# Patient Record
Sex: Male | Born: 1960 | Race: Black or African American | Hispanic: No | Marital: Married | Smoking: Never smoker
Health system: Southern US, Community
[De-identification: ages and names within clinical notes are randomized; demographics above are authoritative.]

## PROBLEM LIST (undated history)

## (undated) ENCOUNTER — Emergency Department (HOSPITAL_COMMUNITY): Admission: EM | Payer: Federal, State, Local not specified - PPO | Source: Home / Self Care

## (undated) DIAGNOSIS — N529 Male erectile dysfunction, unspecified: Secondary | ICD-10-CM

## (undated) DIAGNOSIS — F32A Depression, unspecified: Secondary | ICD-10-CM

## (undated) DIAGNOSIS — R51 Headache: Secondary | ICD-10-CM

## (undated) DIAGNOSIS — F419 Anxiety disorder, unspecified: Secondary | ICD-10-CM

## (undated) DIAGNOSIS — G8929 Other chronic pain: Secondary | ICD-10-CM

## (undated) DIAGNOSIS — R519 Headache, unspecified: Secondary | ICD-10-CM

## (undated) DIAGNOSIS — I10 Essential (primary) hypertension: Secondary | ICD-10-CM

## (undated) DIAGNOSIS — F329 Major depressive disorder, single episode, unspecified: Secondary | ICD-10-CM

## (undated) DIAGNOSIS — E119 Type 2 diabetes mellitus without complications: Secondary | ICD-10-CM

## (undated) DIAGNOSIS — M1990 Unspecified osteoarthritis, unspecified site: Secondary | ICD-10-CM

## (undated) HISTORY — DX: Other chronic pain: G89.29

## (undated) HISTORY — DX: Anxiety disorder, unspecified: F41.9

## (undated) HISTORY — DX: Major depressive disorder, single episode, unspecified: F32.9

## (undated) HISTORY — DX: Type 2 diabetes mellitus without complications: E11.9

## (undated) HISTORY — PX: ELBOW BURSA SURGERY: SHX615

## (undated) HISTORY — DX: Male erectile dysfunction, unspecified: N52.9

## (undated) HISTORY — DX: Essential (primary) hypertension: I10

## (undated) HISTORY — DX: Depression, unspecified: F32.A

## (undated) HISTORY — DX: Headache, unspecified: R51.9

## (undated) HISTORY — DX: Headache: R51

## (undated) HISTORY — DX: Unspecified osteoarthritis, unspecified site: M19.90

## (undated) HISTORY — PX: KNEE SURGERY: SHX244

---

## 2001-05-05 ENCOUNTER — Encounter: Payer: Self-pay | Admitting: Emergency Medicine

## 2001-05-05 ENCOUNTER — Emergency Department (HOSPITAL_COMMUNITY): Admission: EM | Admit: 2001-05-05 | Discharge: 2001-05-06 | Payer: Self-pay | Admitting: Emergency Medicine

## 2008-05-19 DIAGNOSIS — E119 Type 2 diabetes mellitus without complications: Secondary | ICD-10-CM

## 2008-05-19 DIAGNOSIS — I10 Essential (primary) hypertension: Secondary | ICD-10-CM

## 2008-05-19 HISTORY — DX: Type 2 diabetes mellitus without complications: E11.9

## 2008-05-19 HISTORY — DX: Essential (primary) hypertension: I10

## 2010-06-09 ENCOUNTER — Encounter: Payer: Self-pay | Admitting: Family Medicine

## 2010-12-10 ENCOUNTER — Other Ambulatory Visit: Payer: Self-pay | Admitting: Physical Medicine and Rehabilitation

## 2010-12-10 DIAGNOSIS — M25522 Pain in left elbow: Secondary | ICD-10-CM

## 2010-12-12 ENCOUNTER — Ambulatory Visit
Admission: RE | Admit: 2010-12-12 | Discharge: 2010-12-12 | Disposition: A | Discharge status: Home / Self Care | Payer: Federal, State, Local not specified - PPO | Source: Ambulatory Visit | Attending: Physical Medicine and Rehabilitation | Admitting: Physical Medicine and Rehabilitation

## 2010-12-12 DIAGNOSIS — M25522 Pain in left elbow: Secondary | ICD-10-CM

## 2010-12-13 ENCOUNTER — Other Ambulatory Visit: Payer: Self-pay

## 2011-10-01 ENCOUNTER — Ambulatory Visit (INDEPENDENT_AMBULATORY_CARE_PROVIDER_SITE_OTHER): Payer: Federal, State, Local not specified - PPO | Admitting: Family Medicine

## 2011-10-01 ENCOUNTER — Ambulatory Visit: Payer: Federal, State, Local not specified - PPO

## 2011-10-01 VITALS — BP 143/81 | HR 65 | Temp 98.4°F | Resp 18 | Ht 73.0 in | Wt 230.4 lb

## 2011-10-01 DIAGNOSIS — R05 Cough: Secondary | ICD-10-CM

## 2011-10-01 DIAGNOSIS — R059 Cough, unspecified: Secondary | ICD-10-CM

## 2011-10-01 DIAGNOSIS — J209 Acute bronchitis, unspecified: Secondary | ICD-10-CM

## 2011-10-01 DIAGNOSIS — E119 Type 2 diabetes mellitus without complications: Secondary | ICD-10-CM

## 2011-10-01 LAB — POCT CBC
Granulocyte percent: 52.7 %G (ref 37–80)
HCT, POC: 44.2 % (ref 43.5–53.7)
Hemoglobin: 14.6 g/dL (ref 14.1–18.1)
Lymph, poc: 2.5 (ref 0.6–3.4)
MCH, POC: 27.5 pg (ref 27–31.2)
MCHC: 33 g/dL (ref 31.8–35.4)
MCV: 83.3 fL (ref 80–97)
MID (cbc): 0.6 (ref 0–0.9)
MPV: 9.3 fL (ref 0–99.8)
POC Granulocyte: 3.5 (ref 2–6.9)
POC LYMPH PERCENT: 37.6 %L (ref 10–50)
POC MID %: 9.7 %M (ref 0–12)
Platelet Count, POC: 302 10*3/uL (ref 142–424)
RBC: 5.31 M/uL (ref 4.69–6.13)
RDW, POC: 14.4 %
WBC: 6.6 10*3/uL (ref 4.6–10.2)

## 2011-10-01 LAB — LIPID PANEL
Cholesterol: 160 mg/dL (ref 0–200)
HDL: 38 mg/dL — ABNORMAL LOW (ref >39)
LDL Cholesterol: 81 mg/dL (ref 0–99)
Total CHOL/HDL Ratio: 4.2 Ratio
Triglycerides: 206 mg/dL — ABNORMAL HIGH (ref <150)
VLDL: 41 mg/dL — ABNORMAL HIGH (ref 0–40)

## 2011-10-01 LAB — GLUCOSE, POCT (MANUAL RESULT ENTRY): POC Glucose: 102

## 2011-10-01 LAB — POCT GLYCOSYLATED HEMOGLOBIN (HGB A1C): Hemoglobin A1C: 7.5

## 2011-10-01 MED ORDER — HYDROCODONE-HOMATROPINE 5-1.5 MG/5ML PO SYRP: 5.0000 mL | ORAL_SOLUTION | Freq: Three times a day (TID) | ORAL | Status: AC | PRN

## 2011-10-01 MED ORDER — METFORMIN HCL 500 MG PO TABS: 500.0000 mg | ORAL_TABLET | Freq: Two times a day (BID) | ORAL | Status: AC

## 2011-10-01 MED ORDER — AZITHROMYCIN 250 MG PO TABS: ORAL_TABLET | ORAL | Status: AC

## 2011-10-01 NOTE — Patient Instructions (Signed)
Diabetes and Exercise Regular exercise is important and can help:   Control blood glucose (sugar).   Decrease blood pressure.    Control blood lipids (cholesterol, triglycerides).   Improve overall health.  BENEFITS FROM EXERCISE  Improved fitness.   Improved flexibility.   Improved endurance.   Increased bone density.   Weight control.   Increased muscle strength.   Decreased body fat.   Improvement of the body's use of insulin, a hormone.   Increased insulin sensitivity.   Reduction of insulin needs.   Reduced stress and tension.   Helps you feel better.  People with diabetes who add exercise to their lifestyle gain additional benefits, including:  Weight loss.   Reduced appetite.   Improvement of the body's use of blood glucose.   Decreased risk factors for heart disease:   Lowering of cholesterol and triglycerides.   Raising the level of good cholesterol (high-density lipoproteins, HDL).   Lowering blood sugar.   Decreased blood pressure.  TYPE 1 DIABETES AND EXERCISE  Exercise will usually lower your blood glucose.   If blood glucose is greater than 240 mg/dl, check urine ketones. If ketones are present, do not exercise.   Location of the insulin injection sites may need to be adjusted with exercise. Avoid injecting insulin into areas of the body that will be exercised. For example, avoid injecting insulin into:   The arms when playing tennis.   The legs when jogging. For more information, discuss this with your caregiver.   Keep a record of:   Food intake.   Type and amount of exercise.   Expected peak times of insulin action.   Blood glucose levels.  Do this before, during, and after exercise. Review your records with your caregiver. This will help you to develop guidelines for adjusting food intake and insulin amounts.  TYPE 2 DIABETES AND EXERCISE  Regular physical activity can help control blood glucose.   Exercise is important  because it may:   Increase the body's sensitivity to insulin.   Improve blood glucose control.   Exercise reduces the risk of heart disease. It decreases serum cholesterol and triglycerides. It also lowers blood pressure.   Those who take insulin or oral hypoglycemic agents should watch for signs of hypoglycemia. These signs include dizziness, shaking, sweating, chills, and confusion.   Body water is lost during exercise. It must be replaced. This will help to avoid loss of body fluids (dehydration) or heat stroke.  Be sure to talk to your caregiver before starting an exercise program to make sure it is safe for you. Remember, any activity is better than none.  Document Released: 07/26/2003 Document Revised: 04/24/2011 Document Reviewed: 11/09/2008 [facility] Patient Information 2012 [place], [place].Bronchitis Bronchitis is the body's way of reacting to injury and/or infection (inflammation) of the bronchi. Bronchi are the air tubes that extend from the windpipe into the lungs. If the inflammation becomes severe, it may cause shortness of breath. CAUSES  Inflammation may be caused by:  A virus.   Germs (bacteria).   Dust.   Allergens.   Pollutants and many other irritants.  The cells lining the bronchial tree are covered with tiny hairs (cilia). These constantly beat upward, away from the lungs, toward the mouth. This keeps the lungs free of pollutants. When these cells become too irritated and are unable to do their job, mucus begins to develop. This causes the characteristic cough of bronchitis. The cough clears the lungs when the cilia are unable to do  their job. Without either of these protective mechanisms, the mucus would settle in the lungs. Then you would develop pneumonia. Smoking is a common cause of bronchitis and can contribute to pneumonia. Stopping this habit is the single most important thing you can do to help yourself. TREATMENT   Your caregiver may prescribe an  antibiotic if the cough is caused by bacteria. Also, medicines that open up your airways make it easier to breathe. Your caregiver may also recommend or prescribe an expectorant. It will loosen the mucus to be coughed up. Only take over-the-counter or prescription medicines for pain, discomfort, or fever as directed by your caregiver.   Removing whatever causes the problem (smoking, for example) is critical to preventing the problem from getting worse.   Cough suppressants may be prescribed for relief of cough symptoms.   Inhaled medicines may be prescribed to help with symptoms now and to help prevent problems from returning.   For those with recurrent (chronic) bronchitis, there may be a need for steroid medicines.  SEEK IMMEDIATE MEDICAL CARE IF:   During treatment, you develop more pus-like mucus (purulent sputum).   You have a fever.   Your baby is older than 3 months with a rectal temperature of 102 F (38.9 C) or higher.   Your baby is 79 months old or younger with a rectal temperature of 100.4 F (38 C) or higher.   You become progressively more ill.   You have increased difficulty breathing, wheezing, or shortness of breath.  It is necessary to seek immediate medical care if you are elderly or sick from any other disease. MAKE SURE YOU:   Understand these instructions.   Will watch your condition.   Will get help right away if you are not doing well or get worse.  Document Released: 05/05/2005 Document Revised: 04/24/2011 Document Reviewed: 03/14/2008 [facility] Patient Information 2012 [place], [place].

## 2011-10-01 NOTE — Progress Notes (Signed)
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   The cough keeps him awake at night with spells of spasmodic cough. Headaches are not relieved by BC's, ibuprofen  Patient recently learned that he is diabetic, but is not taking any meds ([facility]es to the [facility])  O: Alert, NAD EOMI, no facial weakness, neck supple Oroph:  Deep cavity in tooth #13, marked nasal congestion, TM's okay No adenopathy Chest:  Decreased BS with ronchi left side Heart: reg, no murmur [facility] reading (PRIMARY) by  Dr. Milus Glazier  CXR. No infiltrate Results for orders placed in visit on 10/01/11  POCT CBC      Component Value Range   WBC 6.6  4.6 - 10.2 (K/uL)   Lymph, poc 2.5  0.6 - 3.4    POC LYMPH PERCENT 37.6  10 - 50 (%L)   MID (cbc) 0.6  0 - 0.9    POC MID % 9.7  0 - 12 (%M)   POC Granulocyte 3.5  2 - 6.9    Granulocyte percent 52.7  37 - 80 (%G)   RBC 5.31  4.69 - 6.13 (M/uL)   Hemoglobin 14.6  14.1 - 18.1 (g/dL)   HCT, POC 16.1  09.6 - 53.7 (%)   MCV 83.3  80 - 97 (fL)   MCH, POC 27.5  27 - 31.2 (pg)   MCHC 33.0  31.8 - 35.4 (g/dL)   RDW, POC 04.5     Platelet Count, POC 302  142 - 424 (K/uL)   MPV 9.3  0 - 99.8 (fL)  POCT GLYCOSYLATED HEMOGLOBIN (HGB A1C)      Component Value Range   Hemoglobin A1C 7.5    GLUCOSE, POCT (MANUAL RESULT ENTRY)      Component Value Range   POC Glucose 102     Assessment: Bronchitis, diabetes with only fair control.  Plan: Hydromet, Z-Pak  Start metformin  Recheck 48 hours

## 2011-10-03 ENCOUNTER — Ambulatory Visit (INDEPENDENT_AMBULATORY_CARE_PROVIDER_SITE_OTHER): Payer: Federal, State, Local not specified - PPO | Admitting: Family Medicine

## 2011-10-03 VITALS — BP 138/93 | HR 67 | Temp 98.2°F | Resp 16 | Ht 74.0 in | Wt 238.0 lb

## 2011-10-03 DIAGNOSIS — J40 Bronchitis, not specified as acute or chronic: Secondary | ICD-10-CM

## 2011-10-03 DIAGNOSIS — J209 Acute bronchitis, unspecified: Secondary | ICD-10-CM

## 2011-10-03 DIAGNOSIS — K047 Periapical abscess without sinus: Secondary | ICD-10-CM

## 2011-10-03 NOTE — Patient Instructions (Addendum)
Continue current medications   Health Maintenance, Males A healthy lifestyle and preventative care can promote health and wellness.  Maintain regular health, dental, and eye exams.   Eat a healthy diet. Foods like vegetables, fruits, whole grains, low-fat dairy products, and lean protein foods contain the nutrients you need without too many calories. Decrease your intake of foods high in solid fats, added sugars, and salt. Get information about a proper diet from your caregiver, if necessary.   Regular physical exercise is one of the most important things you can do for your health. Most adults should get at least 150 minutes of moderate-intensity exercise (any activity that increases your heart rate and causes you to sweat) each week. In addition, most adults need muscle-strengthening exercises on 2 or more days a week.    Maintain a healthy weight. The body mass index (BMI) is a screening tool to identify possible weight problems. It provides an estimate of body fat based on height and weight. Your caregiver can help determine your BMI, and can help you achieve or maintain a healthy weight. For adults 20 years and older:   A BMI below 18.5 is considered underweight.   A BMI of 18.5 to 24.9 is normal.   A BMI of 25 to 29.9 is considered overweight.   A BMI of 30 and above is considered obese.   Maintain normal blood lipids and cholesterol by exercising and minimizing your intake of saturated fat. Eat a balanced diet with plenty of fruits and vegetables. Blood tests for lipids and cholesterol should begin at age 75 and be repeated every 5 years. If your lipid or cholesterol levels are high, you are over 50, or you are a high risk for heart disease, you may need your cholesterol levels checked more frequently.Ongoing high lipid and cholesterol levels should be treated with medicines, if diet and exercise are not effective.   If you smoke, find out from your caregiver how to quit. If you do  not use tobacco, do not start.   If you choose to drink alcohol, do not exceed 2 drinks per day. One drink is considered to be 12 ounces (355 mL) of beer, 5 ounces (148 mL) of wine, or 1.5 ounces (44 mL) of liquor.   Avoid use of street drugs. Do not share needles with anyone. Ask for help if you need support or instructions about stopping the use of drugs.   High blood pressure causes heart disease and increases the risk of stroke. Blood pressure should be checked at least every 1 to 2 years. Ongoing high blood pressure should be treated with medicines if weight loss and exercise are not effective.   If you are 23 to 51 years old, ask your caregiver if you should take aspirin to prevent heart disease.   Diabetes screening involves taking a blood sample to check your fasting blood sugar level. This should be done once every 3 years, after age 44, if you are within normal weight and without risk factors for diabetes. Testing should be considered at a younger age or be carried out more frequently if you are overweight and have at least 1 risk factor for diabetes.   Colorectal cancer can be detected and often prevented. Most routine colorectal cancer screening begins at the age of 25 and continues through age 21. However, your caregiver may recommend screening at an earlier age if you have risk factors for colon cancer. On a yearly basis, your caregiver may provide home test  kits to check for hidden blood in the stool. Use of a small camera at the end of a tube, to directly examine the colon (sigmoidoscopy or colonoscopy), can detect the earliest forms of colorectal cancer. Talk to your caregiver about this at age 66, when routine screening begins. Direct examination of the colon should be repeated every 5 to 10 years through age 106, unless early forms of pre-cancerous polyps or small growths are found.   Hepatitis C blood testing is recommended for all people born from [birth year] through 1965 and any  individual with known risks for hepatitis C.   Healthy men should no longer receive prostate-specific antigen (PSA) blood tests as part of routine cancer screening. Consult with your caregiver about prostate cancer screening.   Testicular cancer screening is not recommended for adolescents or adult males who have no symptoms. Screening includes self-exam, caregiver exam, and other screening tests. Consult with your caregiver about any symptoms you have or any concerns you have about testicular cancer.   Practice safe sex. Use condoms and avoid high-risk sexual practices to reduce the spread of sexually transmitted infections (STIs).   Use sunscreen with a sun protection factor (SPF) of 30 or greater. Apply sunscreen liberally and repeatedly throughout the day. You should seek shade when your shadow is shorter than you. Protect yourself by wearing long sleeves, pants, a wide-brimmed hat, and sunglasses year round, whenever you are outdoors.   Notify your caregiver of new moles or changes in moles, especially if there is a change in shape or color. Also notify your caregiver if a mole is larger than the size of a pencil eraser.   A one-time screening for abdominal aortic aneurysm (AAA) and surgical repair of large AAAs by sound wave imaging (ultrasonography) is recommended for ages 16 to 54 years who are current or former smokers.   Stay current with your immunizations.  Document Released: 11/01/2007 Document Revised: 04/24/2011 Document Reviewed: 09/30/2010 [facility] Patient Information 2012 [place], [place].

## 2011-10-03 NOTE — Progress Notes (Signed)
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  He has appt in [facility].  Went to [place] for HTN and diabetes today already.  Overall, feeling better.  O:  NAD Dental:  Abscess resolving Chest:  Clear  A: resolving bronchitis  P:  Finish abx RTC prn

## 2012-03-03 ENCOUNTER — Encounter: Payer: Self-pay | Admitting: Medical

## 2012-03-03 ENCOUNTER — Ambulatory Visit (INDEPENDENT_AMBULATORY_CARE_PROVIDER_SITE_OTHER): Payer: Federal, State, Local not specified - PPO | Admitting: Medical

## 2012-03-03 VITALS — BP 140/90 | HR 68 | Temp 98.2°F | Resp 16 | Ht 74.0 in | Wt 232.0 lb

## 2012-03-03 DIAGNOSIS — I10 Essential (primary) hypertension: Secondary | ICD-10-CM

## 2012-03-03 DIAGNOSIS — R42 Dizziness and giddiness: Secondary | ICD-10-CM

## 2012-03-03 DIAGNOSIS — F17200 Nicotine dependence, unspecified, uncomplicated: Secondary | ICD-10-CM

## 2012-03-03 DIAGNOSIS — Z720 Tobacco use: Secondary | ICD-10-CM

## 2012-03-03 DIAGNOSIS — E119 Type 2 diabetes mellitus without complications: Secondary | ICD-10-CM

## 2012-03-03 DIAGNOSIS — R9431 Abnormal electrocardiogram [ECG] [EKG]: Secondary | ICD-10-CM

## 2012-03-03 MED ORDER — ATENOLOL 25 MG PO TABS: 25.0000 mg | ORAL_TABLET | Freq: Every day | ORAL | Status: DC

## 2012-03-03 MED ORDER — LISINOPRIL 20 MG PO TABS: 20.0000 mg | ORAL_TABLET | Freq: Every day | ORAL | Status: DC

## 2012-03-03 NOTE — Progress Notes (Signed)
Subjective: Here as a new patient today.  Has always seen the [facility] hospital in [place] or [place], but feels like they don't take time to look things over, and he doesn't interact well with some office staff.  Wants a local primary care provider.   He has hx/o diabetes and hypertension.  He had been on Lisinopril for years, but a week ago when he went to the [place], BP was 180/133.   They added HCTZ.   However, since starting the HCTZ he notes bad headache, dizziness.  He has used Amlodipine in remote past and had fatigue, leg aches, sluggish feeling.   Has tolerated Lisinopril just fine.  He feels like he can't keeping taking HCTZ. He thinks his BP elevation is due to stress.  Having marital issues.     [facility].  Last cardiology eval 10+ years ago.   Just had labs last week, cholesterol was normal.  Not sure about the other labs but thinks they were normal.    Diabetes - checks glucose some, gets 139-140s in the mornings, fasting.   BPs been running 140s/90s.   Past Medical History  Diagnosis Date  . Diabetes mellitus without complication 2010    type II  . Hypertension 2010  . Chronic headache   . Depression   . Anxiety   . ED (erectile dysfunction)   . Arthritis    ROS Gen: no fatigue, no weight changes, no fever Heent: negative Lungs: intermittent occasional SOB with eating?.  otherwise denies SOB, DOE Heart: intermittent brief sharp chest pains.  Not particularly related to activity.  No arm pain or numbness, no associated nausea, sweats, paresthesias. Ext: no edema Neuro: headache, dizziness, no numbness, tingling, weakness    Objective:   Physical Exam  Filed Vitals:   03/03/12 1026  BP: 140/90  Pulse: 68  Temp: 98.2 F (36.8 C)  Resp: 16    General appearance: alert, no distress, WD/WN, AA male HEENT: normocephalic, sclerae anicteric, TMs pearly, nares patent, no discharge or erythema, pharynx normal Oral cavity: MMM, left upper  molars with obvious decay Neck: supple, no lymphadenopathy, no thyromegaly, no masses, no bruits Heart: RRR, normal S1, S2, no murmurs Lungs: CTA bilaterally, no wheezes, rhonchi, or rales Abdomen: +bs, soft, non tender, non distended, no masses, no hepatomegaly, no splenomegaly Pulses: 2+ symmetric, upper and lower extremities, normal cap refill Neuro: Cn2-12 intact, nonfocal exam   Adult ECG Report  Indication: hypertension, dizziness  Rate: 63bpm  Rhythm: normal sinus rhythm  QRS Axis: 38 degrees  PR Interval:  QRS Duration:  QTc:  Conduction Disturbances: none  Other Abnormalities: possible ST elevation V2, V3, T wave abnormality V3, V4  Patient's cardiac risk factors are: diabetes mellitus, hypertension, male gender and smoking/ tobacco exposure.  EKG comparison: none  Narrative Interpretation: possible anterior ischemia/strain pattern    Assessment and Plan :    Encounter Diagnoses  Name Primary?  . Essential hypertension, benign Yes  . Abnormal EKG   . Type II or unspecified type diabetes mellitus without mention of complication, not stated as uncontrolled   . Tobacco use   . Dizziness    HTN - stop Lisinopril HCT.  Begin Lisinopril 20mg  daily, begin Atenolol 25mg  daily.    Abnormal EKG- i discussed the abnormalities.  I strongly recommended we get him in with cardiology this week.  He declines referral today [facility].  He will take EKG with him and wants to pursue cardiology consult through [facility] tomorrow instead of local cardiology referral.  I discussed the importance of f/u within a week given EKG findings.  He assures me he will call back for referral pending his consult at [facility] tomorrow.  Discussed his risk factors, needed evaluation, need to stop tobacco and get BP under control.   Diabetes - he will bring back labs that were just done.    Tobacco use - need to stop tobacco completley although he currently uses this  intermittent.    Dizziness - f/u with cardiology.

## 2012-04-01 ENCOUNTER — Encounter: Payer: Self-pay | Admitting: Family Medicine

## 2012-04-01 ENCOUNTER — Ambulatory Visit (INDEPENDENT_AMBULATORY_CARE_PROVIDER_SITE_OTHER): Payer: Federal, State, Local not specified - PPO | Admitting: Family Medicine

## 2012-04-01 VITALS — BP 130/82 | HR 78 | Wt 237.0 lb

## 2012-04-01 DIAGNOSIS — M948X9 Other specified disorders of cartilage, unspecified sites: Secondary | ICD-10-CM

## 2012-04-01 DIAGNOSIS — M2580 Other specified joint disorders, unspecified joint: Secondary | ICD-10-CM

## 2012-04-01 NOTE — Progress Notes (Signed)
  Subjective:    Patient ID: Kelly Valdez, male    DOB: [birth year], 51 y.o.   MRN: [id number]  HPI He complains of left great toe pain for the last 2 days. This started after he got a  Shoes. He then went back to wearing his old shoes and still had some slight difficulty but states that today he is having less difficulty with the pain.   Review of Systems     Objective:   Physical Exam  left great toe shows no pain on motion of any the joints. No tenderness to palpation. It is not hot or warm to touch. He does have some slight tenderness over the sesamoid.      Assessment & Plan:   1. Sesamoiditis    I explained the diagnoses with him in detail. Recommended wearing an arch support for this. He will continue on ibuprofen and use Tylenol if he needs it. Also discussed possibly going back to his old shoes.

## 2012-04-01 NOTE — Patient Instructions (Signed)
To back to using-year-old shoes or put good arch supports in the new ones. Keep taking the ibuprofen and you can add Tylenol, 2 tablets 4 times a day to that.

## 2012-10-15 ENCOUNTER — Other Ambulatory Visit: Payer: Self-pay | Admitting: Family Medicine

## 2014-04-18 ENCOUNTER — Ambulatory Visit (INDEPENDENT_AMBULATORY_CARE_PROVIDER_SITE_OTHER): Payer: Federal, State, Local not specified - PPO | Admitting: Emergency Medicine

## 2014-04-18 VITALS — BP 132/88 | HR 80 | Temp 98.3°F | Resp 16 | Ht 74.0 in | Wt 223.8 lb

## 2014-04-18 DIAGNOSIS — A084 Viral intestinal infection, unspecified: Secondary | ICD-10-CM

## 2014-04-18 DIAGNOSIS — R1030 Lower abdominal pain, unspecified: Secondary | ICD-10-CM

## 2014-04-18 LAB — BASIC METABOLIC PANEL
BUN: 27 mg/dL — ABNORMAL HIGH (ref 6–23)
CO2: 23 mEq/L (ref 19–32)
Calcium: 9.7 mg/dL (ref 8.4–10.5)
Chloride: 104 mEq/L (ref 96–112)
Creat: 1.72 mg/dL — ABNORMAL HIGH (ref 0.50–1.35)
Glucose, Bld: 253 mg/dL — ABNORMAL HIGH (ref 70–99)
Potassium: 3.9 mEq/L (ref 3.5–5.3)
Sodium: 138 mEq/L (ref 135–145)

## 2014-04-18 LAB — POCT CBC
Granulocyte percent: 44 %G (ref 37–80)
HCT, POC: 48 % (ref 43.5–53.7)
Hemoglobin: 15.6 g/dL (ref 14.1–18.1)
LYMPH, POC: 2.7 (ref 0.6–3.4)
MCH: 28 pg (ref 27–31.2)
MCHC: 32.6 g/dL (ref 31.8–35.4)
MCV: 85.9 fL (ref 80–97)
MID (CBC): 0.5 (ref 0–0.9)
MPV: 7.7 fL (ref 0–99.8)
PLATELET COUNT, POC: 189 10*3/uL (ref 142–424)
POC Granulocyte: 2.5 (ref 2–6.9)
POC LYMPH %: 47.9 % (ref 10–50)
POC MID %: 8.1 % (ref 0–12)
RBC: 5.59 M/uL (ref 4.69–6.13)
RDW, POC: 14.5 %
WBC: 5.7 10*3/uL (ref 4.6–10.2)

## 2014-04-18 MED ORDER — LOPERAMIDE HCL 2 MG PO TABS: 2.0000 mg | ORAL_TABLET | Freq: Four times a day (QID) | ORAL | Status: DC | PRN

## 2014-04-18 MED ORDER — ONDANSETRON 8 MG PO TBDP: 8.0000 mg | ORAL_TABLET | Freq: Three times a day (TID) | ORAL | Status: DC | PRN

## 2014-04-18 NOTE — Progress Notes (Signed)
[facility] [address], [place] [id number] [phone]  Date:  04/18/2014   Name:  Kelly SearMichael T Azad   DOB:  11/25/1960   MRN:  [id number]  PCP:  Carollee HerterLALONDE,JOHN CHARLES, MD    Chief Complaint: Bloated; Abdominal Cramping; Fatigue; and Diarrhea   History of Present Illness:  Kelly Valdez is a 53 y.o. very pleasant male patient who presents with the following:  Regular BID bowel movements.  Says Saturday he developed loose stools.  The patient has no complaint of blood, mucous, or pus in her stools.  Has cramping abdominal pain and nausea associated with fevered sensation on Monday.  No vomiting. Wife has similar symptoms.   No appetite.   Pain across lower abdomen. No improvement with over the counter medications or other home remedies.  Denies other complaint or health concern today.   There are no active problems to display for this patient.   Past Medical History  Diagnosis Date  . Diabetes mellitus without complication 2010    type II  . Hypertension 2010  . Chronic headache   . Depression   . Anxiety   . ED (erectile dysfunction)   . Arthritis     No past surgical history on file.  History  Substance Use Topics  . Smoking status: Never Smoker   . Smokeless tobacco: Never Used  . Alcohol Use: No    No family history on file.  No Known Allergies  Medication list has been reviewed and updated.  Current Outpatient Prescriptions on File Prior to Visit  Medication Sig Dispense Refill  . atenolol (TENORMIN) 25 MG tablet Take 1 tablet (25 mg total) by mouth daily. 30 tablet 2  . ibuprofen (ADVIL,MOTRIN) 800 MG tablet Take 800 mg by mouth 3 (three) times daily.    Marland Kitchen. lisinopril (PRINIVIL,ZESTRIL) 20 MG tablet Take 1 tablet (20 mg total) by mouth daily. 30 tablet 2  . traMADol (ULTRAM) 50 MG tablet Take 50 mg by mouth every 6 (six) hours as needed.    . traZODone (DESYREL) 50 MG tablet Take 50 mg by mouth at bedtime.    . metFORMIN  (GLUCOPHAGE) 500 MG tablet Take 1 tablet (500 mg total) by mouth 2 (two) times daily with a meal. 180 tablet 3   No current facility-administered medications on file prior to visit.    Review of Systems:  As per HPI, otherwise negative.    Physical Examination: Filed Vitals:   04/18/14 0906  BP: 132/88  Pulse: 80  Temp: 98.3 F (36.8 C)  Resp: 16   Filed Vitals:   04/18/14 0906  Height: 6\' 2"  (1.88 m)  Weight: 223 lb 12.8 oz (101.515 kg)   Body mass index is 28.72 kg/(m^2). Ideal Body Weight: Weight in (lb) to have BMI = 25: 194.3  GEN: WDWN, NAD, Non-toxic, A & O x 3 HEENT: Atraumatic, Normocephalic. Neck supple. No masses, No LAD. Ears and Nose: No external deformity. CV: RRR, No M/G/R. No JVD. No thrill. No extra heart sounds. PULM: CTA B, no wheezes, crackles, rhonchi. No retractions. No resp. distress. No accessory muscle use. ABD: S, NT, ND, +BS. No rebound. No HSM. EXTR: No c/c/e NEURO Normal gait.  PSYCH: Normally interactive. Conversant. Not depressed or anxious appearing.  Calm demeanor.    Assessment and Plan: Gastroenteritis Needs colonoscopy zofran Imodium  Signed,  Phillips OdorJeffery Yicel Shannon, MD   Results for orders placed or performed in visit on 04/18/14  POCT CBC  Result  Value Ref Range   WBC 5.7 4.6 - 10.2 K/uL   Lymph, poc 2.7 0.6 - 3.4   POC LYMPH PERCENT 47.9 10 - 50 %L   MID (cbc) 0.5 0 - 0.9   POC MID % 8.1 0 - 12 %M   POC Granulocyte 2.5 2 - 6.9   Granulocyte percent 44.0 37 - 80 %G   RBC 5.59 4.69 - 6.13 M/uL   Hemoglobin 15.6 14.1 - 18.1 g/dL   HCT, POC 16.148.0 09.643.5 - 53.7 %   MCV 85.9 80 - 97 fL   MCH, POC 28.0 27 - 31.2 pg   MCHC 32.6 31.8 - 35.4 g/dL   RDW, POC 04.514.5 %   Platelet Count, POC 189 142 - 424 K/uL   MPV 7.7 0 - 99.8 fL

## 2014-04-18 NOTE — Patient Instructions (Signed)

## 2014-12-15 ENCOUNTER — Ambulatory Visit (INDEPENDENT_AMBULATORY_CARE_PROVIDER_SITE_OTHER): Payer: Federal, State, Local not specified - PPO | Admitting: Emergency Medicine

## 2014-12-15 VITALS — BP 110/60 | HR 96 | Temp 98.3°F | Resp 18 | Ht 74.0 in | Wt 208.0 lb

## 2014-12-15 DIAGNOSIS — T783XXA Angioneurotic edema, initial encounter: Secondary | ICD-10-CM

## 2014-12-15 DIAGNOSIS — I10 Essential (primary) hypertension: Secondary | ICD-10-CM | POA: Diagnosis not present

## 2014-12-15 DIAGNOSIS — E119 Type 2 diabetes mellitus without complications: Secondary | ICD-10-CM | POA: Diagnosis not present

## 2014-12-15 MED ORDER — LOSARTAN POTASSIUM 50 MG PO TABS: 50.0000 mg | ORAL_TABLET | Freq: Every day | ORAL | Status: DC

## 2014-12-15 MED ORDER — PREDNISONE 10 MG (21) PO TBPK: ORAL_TABLET | ORAL | Status: DC

## 2014-12-15 NOTE — Patient Instructions (Signed)
Angioedema °Angioedema is a sudden swelling of tissues, often of the skin. It can occur on the face or genitals or in the abdomen or other body parts. The swelling usually develops over a short period and gets better in 24 to 48 hours. It often begins during the night and is found when the person wakes up. The person may also get red, itchy patches of skin (hives). Angioedema can be dangerous if it involves swelling of the air passages.  °Depending on the cause, episodes of angioedema may only happen once, come back in unpredictable patterns, or repeat for several years and then gradually fade away.  °CAUSES  °Angioedema can be caused by an allergic reaction to various triggers. It can also result from nonallergic causes, including reactions to drugs, immune system disorders, viral infections, or an abnormal gene that is passed to you from your parents (hereditary). For some people with angioedema, the cause is unknown.  °Some things that can trigger angioedema include:  °· Foods.   °· Medicines, such as ACE inhibitors, ARBs, nonsteroidal anti-inflammatory agents, or estrogen.   °· Latex.   °· Animal saliva.   °· Insect stings.   °· Dyes used in X-rays.   °· Mild injury.   °· Dental work. °· Surgery. °· Stress.   °· Sudden changes in temperature.   °· Exercise. °SIGNS AND SYMPTOMS  °· Swelling of the skin. °· Hives. If these are present, there is also intense itching. °· Redness in the affected area.   °· Pain in the affected area. °· Swollen lips or tongue. °· Breathing problems. This may happen if the air passages swell. °· Wheezing. °If internal organs are involved, there may be:  °· Nausea.   °· Abdominal pain.   °· Vomiting.   °· Difficulty swallowing.   °· Difficulty passing urine. °DIAGNOSIS  °· Your health care provider will examine the affected area and take a medical and family history. °· Various tests may be done to help determine the cause. Tests may include: °¨ Allergy skin tests to see if the problem  is an allergic reaction.   °¨ Blood tests to check for hereditary angioedema.   °¨ Tests to check for underlying diseases that could cause the condition.   °· A review of your medicines, including over-the-counter medicines, may be done. °TREATMENT  °Treatment will depend on the cause of the angioedema. Possible treatments include:  °· Removal of anything that triggered the condition (such as stopping certain medicines).   °· Medicines to treat symptoms or prevent attacks. Medicines given may include:   °¨ Antihistamines.   °¨ Epinephrine injection.   °¨ Steroids.   °· Hospitalization may be required for severe attacks. If the air passages are affected, it can be an emergency. Tubes may need to be placed to keep the airway open. °HOME CARE INSTRUCTIONS  °· Take all medicines as directed by your health care provider. °· If you were given medicines for emergency allergy treatment, always carry them with you. °· Wear a medical bracelet as directed by your health care provider.   °· Avoid known triggers. °SEEK MEDICAL CARE IF:  °· You have repeat attacks of angioedema.   °· Your attacks are more frequent or more severe despite preventive measures.   °· You have hereditary angioedema and are considering having children. It is important to discuss with your health care provider the risks of passing the condition on to your children. °SEEK IMMEDIATE MEDICAL CARE IF:  °· You have severe swelling of the mouth, tongue, or lips. °· You have difficulty breathing.   °· You have difficulty swallowing.   °· You faint. °MAKE   SURE YOU: °· Understand these instructions. °· Will watch your condition. °· Will get help right away if you are not doing well or get worse. °Document Released: 07/14/2001 Document Revised: 09/19/2013 Document Reviewed: 12/27/2012 °ExitCare® Patient Information ©2015 [employer]. This information is not intended to replace advice given to you by your health care provider. Make sure you discuss any questions  you have with your health care provider. ° °

## 2014-12-15 NOTE — Progress Notes (Signed)
Subjective:  Patient ID: Kelly Valdez, male    DOB: [birth year]  Age: 54 y.o. MRN: [id number]  CC: Sinus Problem; Hot Flashes; Headache; Facial Swelling; and Weight Loss   HPI [name] presents  with multiple complaints he has facial swelling and headaches since 3 days ago. He's had swelling in the lips today. He has no dysphonia or hoarseness. He has no difficulty swallowing. Has no shortness of breath. No Wheezing. He's been using lisinopril for blood pressure control for years with no adverse effect. Said he's been nauseated and has some difficulty holding down solid food. Is tolerating liquids well. He has no fever chills no neurologic or visual symptoms  History Kelly Valdez has a past medical history of Diabetes mellitus without complication (2010); Hypertension (2010); Chronic headache; Depression; Anxiety; ED (erectile dysfunction); and Arthritis.   He has no past surgical history on file.   His  family history is not on file.  He   reports that he has never smoked. He has never used smokeless tobacco. He reports that he does not drink alcohol or use illicit drugs.  Outpatient Prescriptions Prior to Visit  Medication Sig Dispense Refill  . ibuprofen (ADVIL,MOTRIN) 800 MG tablet Take 800 mg by mouth 3 (three) times daily.    . metFORMIN (GLUCOPHAGE) 500 MG tablet Take 1 tablet (500 mg total) by mouth 2 (two) times daily with a meal. 180 tablet 3  . lisinopril (PRINIVIL,ZESTRIL) 20 MG tablet Take 1 tablet (20 mg total) by mouth daily. 30 tablet 2  . atenolol (TENORMIN) 25 MG tablet Take 1 tablet (25 mg total) by mouth daily. (Patient not taking: Reported on 12/15/2014) 30 tablet 2  . loperamide (IMODIUM A-D) 2 MG tablet Take 1 tablet (2 mg total) by mouth 4 (four) times daily as needed for diarrhea or loose stools. (Patient not taking: Reported on 12/15/2014) 30 tablet 0  . ondansetron (ZOFRAN-ODT) 8 MG disintegrating tablet Take 1 tablet (8 mg total) by mouth every 8 (eight)  hours as needed for nausea. (Patient not taking: Reported on 12/15/2014) 30 tablet 0  . traMADol (ULTRAM) 50 MG tablet Take 50 mg by mouth every 6 (six) hours as needed.    . traZODone (DESYREL) 50 MG tablet Take 50 mg by mouth at bedtime.     No facility-administered medications prior to visit.    History   Social History  . Marital Status: Married    Spouse Name: N/A  . Number of Children: N/A  . Years of Education: N/A   Social History Main Topics  . Smoking status: Never Smoker   . Smokeless tobacco: Never Used  . Alcohol Use: No  . Drug Use: No  . Sexual Activity: Not on file   Other Topics Concern  . None   Social History Narrative     Review of Systems  Constitutional: Negative for fever, chills and appetite change.  HENT: Negative for congestion, ear pain, postnasal drip, sinus pressure and sore throat.   Eyes: Negative for pain and redness.  Respiratory: Negative for cough, shortness of breath and wheezing.   Cardiovascular: Negative for leg swelling.  Gastrointestinal: Negative for nausea, vomiting, abdominal pain, diarrhea, constipation and blood in stool.  Endocrine: Negative for polyuria.  Genitourinary: Negative for dysuria, urgency, frequency and flank pain.  Musculoskeletal: Negative for gait problem.  Skin: Negative for rash.  Neurological: Negative for weakness and headaches.  Psychiatric/Behavioral: Negative for confusion and decreased concentration. The patient is not nervous/anxious.  Objective:  BP 110/60 mmHg  Pulse 96  Temp(Src) 98.3 F (36.8 C) (Oral)  Resp 18  Ht  (1.88 m)  Wt 208 lb (94.348 kg)  BMI 26.69 kg/m2  SpO2 94%  Physical Exam  Constitutional: He is oriented to person, place, and time. He appears well-developed and well-nourished. No distress.  HENT:  Head: Normocephalic and atraumatic.  Right Ear: External ear normal.  Left Ear: External ear normal.  Nose: Nose normal.  Eyes: Conjunctivae and EOM are normal.  Pupils are equal, round, and reactive to light. No scleral icterus.  Neck: Normal range of motion. Neck supple. No tracheal deviation present.  Cardiovascular: Normal rate, regular rhythm and normal heart sounds.   Pulmonary/Chest: Effort normal. No respiratory distress. He has no wheezes. He has no rales.  Abdominal: He exhibits no mass. There is no tenderness. There is no rebound and no guarding.  Musculoskeletal: He exhibits no edema.  Lymphadenopathy:    He has no cervical adenopathy.  Neurological: He is alert and oriented to person, place, and time. Coordination normal.  Skin: Skin is warm and dry. No rash noted.  Psychiatric: He has a normal mood and affect. His behavior is normal.   he has some swelling of the upper lip on the left.    Assessment & Plan:   Borden was seen today for sinus problem, hot flashes, headache, facial swelling and weight loss.  Diagnoses and all orders for this visit:  Angioedema, initial encounter  Essential hypertension, benign  Type 2 diabetes mellitus without complication  Other orders -     predniSONE (STERAPRED UNI-PAK 21 TAB) 10 MG (21) TBPK tablet; Use as directed on package -     losartan (COZAAR) 50 MG tablet; Take 1 tablet (50 mg total) by mouth daily.   I have discontinued Mr. Wenke lisinopril. I am also having him start on predniSONE and losartan. Additionally, I am having him maintain his metFORMIN, ibuprofen, traMADol, traZODone, atenolol, loperamide, and ondansetron.  Meds ordered this encounter  Medications  . predniSONE (STERAPRED UNI-PAK 21 TAB) 10 MG (21) TBPK tablet    Sig: Use as directed on package    Dispense:  21 tablet    Refill:  0  . losartan (COZAAR) 50 MG tablet    Sig: Take 1 tablet (50 mg total) by mouth daily.    Dispense:  90 tablet    Refill:  3   We discussed at some length the problems associated with angioedema. We also discussed effective steroid-dependent diabetes. We elected to try a short burst  of cortisone and will  Appropriate red flag conditions were discussed with the patient as well as actions that should be taken.  Patient expressed his understanding.  Follow-up: Return in about 1 week (around 12/22/2014).  Carmelina Dane, MD

## 2019-03-02 ENCOUNTER — Other Ambulatory Visit: Payer: Self-pay

## 2019-03-02 ENCOUNTER — Encounter: Payer: Self-pay | Admitting: Emergency Medicine

## 2019-03-02 ENCOUNTER — Ambulatory Visit
Admission: EM | Admit: 2019-03-02 | Discharge: 2019-03-02 | Disposition: A | Discharge status: Home / Self Care | Payer: Federal, State, Local not specified - PPO

## 2019-03-02 DIAGNOSIS — M25561 Pain in right knee: Secondary | ICD-10-CM

## 2019-03-02 DIAGNOSIS — X501XXA Overexertion from prolonged static or awkward postures, initial encounter: Secondary | ICD-10-CM | POA: Diagnosis not present

## 2019-03-02 DIAGNOSIS — S39012A Strain of muscle, fascia and tendon of lower back, initial encounter: Secondary | ICD-10-CM | POA: Diagnosis not present

## 2019-03-02 HISTORY — DX: Other chronic pain: G89.29

## 2019-03-02 MED ORDER — CYCLOBENZAPRINE HCL 5 MG PO TABS: 5.0000 mg | ORAL_TABLET | Freq: Two times a day (BID) | ORAL | 0 refills | Status: DC | PRN

## 2019-03-02 NOTE — Discharge Instructions (Addendum)
Take muscle relaxer as needed for severe pain, spasm. May ice, rest, elevate the area(s) of pain.  You may also use hot compresses/warm wash rags to relieve muscle tightness. May use OTC Tylenol, ibuprofen as needed for pain. Return if you develop worsening pain, chest pain, difficulty breathing, holding your bowels or urine.

## 2019-03-02 NOTE — ED Notes (Signed)
Patient able to ambulate independently  

## 2019-03-02 NOTE — ED Triage Notes (Signed)
Pt presents to [facility] for assessment after his knee "gave out" at work, and he thinks he pulled something in his back approx noon today.  Patient states they are working mandatory overtime at this time, and he thinks he's overworking all of his chronic issues.

## 2019-03-02 NOTE — ED Provider Notes (Signed)
[name]    CSN: [id number] Arrival date & time: 03/02/19  1718      History   Chief Complaint Chief Complaint  Patient presents with  . Back Pain    HPI Kelly Valdez is a 58 y.o. male with history of diabetes, hypertension, chronic back, right knee pain, arthritis presenting for low back pain and right knee pain after incident at work today.  Patient works for [employer], does a lot of heavy lifting, walking.  States is been having mandatory overtime recently.  Patient states that his right knee gives out occasionally, did so earlier today while he was carrying a box which caused him to "catch and twist my back ".  Patient denies pop/snap/tearing sensation in his back; no fall.  Pain is nonradiating: No loss of bowel or bladder function.  Has not tried nothing for his pain.   Past Medical History:  Diagnosis Date  . Anxiety   . Arthritis   . Chronic back pain   . Chronic headache   . Chronic knee pain    right knee  . Depression   . Diabetes mellitus without complication ([place]) 0254   type II  . ED (erectile dysfunction)   . Hypertension 2010    Patient Active Problem List   Diagnosis Date Noted  . Essential hypertension, benign 12/15/2014  . Type 2 diabetes mellitus ([place]) 12/15/2014    History reviewed. No pertinent surgical history.     Home Medications    Prior to Admission medications   Medication Sig Start Date End Date Taking? Authorizing Provider  lisinopril (ZESTRIL) 10 MG tablet Take 10 mg by mouth daily. Unsure of dose   Yes [provider]  atenolol (TENORMIN) 25 MG tablet Take 1 tablet (25 mg total) by mouth daily. Patient not taking: Reported on 12/15/2014 03/03/12   Tysinger, Camelia Eng, PA-C  cyclobenzaprine (FLEXERIL) 5 MG tablet Take 1 tablet (5 mg total) by mouth 2 (two) times daily as needed for muscle spasms. 03/02/19   Hall-Potvin, [place], PA-C  ibuprofen (ADVIL,MOTRIN) 800 MG tablet Take 800 mg by mouth 3 (three) times  daily.    [provider]  losartan (COZAAR) 50 MG tablet Take 1 tablet (50 mg total) by mouth daily. 12/15/14   Roselee Culver, MD  metFORMIN (GLUCOPHAGE) 500 MG tablet Take 1 tablet (500 mg total) by mouth 2 (two) times daily with a meal. 10/01/11 12/15/14  Robyn Haber, MD  traZODone (DESYREL) 50 MG tablet Take 50 mg by mouth at bedtime.  03/02/19  [provider]    Family History History reviewed. No pertinent family history.  Social History Social History   Tobacco Use  . Smoking status: Never Smoker  . Smokeless tobacco: Never Used  Substance Use Topics  . Alcohol use: Yes    Alcohol/week: 0.0 standard drinks    Comment: socially  . Drug use: No     Allergies   Patient has no known allergies.   Review of Systems Review of Systems  Constitutional: Negative for fatigue and fever.  Respiratory: Negative for cough and shortness of breath.   Cardiovascular: Negative for chest pain and palpitations.  Musculoskeletal:       Positive for right knee pain, low back pain  Neurological: Negative for weakness and numbness.     Physical Exam Triage Vital Signs ED Triage Vitals  Enc Vitals Group     BP 03/02/19 1725 131/84     Pulse Rate 03/02/19 1725 66  Resp 03/02/19 1725 16     Temp 03/02/19 1725 97.9 F (36.6 C)     Temp Source 03/02/19 1725 Temporal     SpO2 03/02/19 1725 97 %     Weight --      Height --      Head Circumference --      Peak Flow --      Pain Score 03/02/19 1726 8     Pain Loc --      Pain Edu? --      Excl. in [place]? --    No data found.  Updated Vital Signs BP 131/84 (BP Location: Left Arm)   Pulse 66   Temp 97.9 F (36.6 C) (Temporal)   Resp 16   SpO2 97%   Visual Acuity Right Eye Distance:   Left Eye Distance:   Bilateral Distance:    Right Eye Near:   Left Eye Near:    Bilateral Near:     Physical Exam Constitutional:      General: He is not in acute distress. HENT:     Head: Normocephalic and  atraumatic.  Eyes:     General: No scleral icterus.    Pupils: Pupils are equal, round, and reactive to light.  Cardiovascular:     Rate and Rhythm: Normal rate.  Pulmonary:     Effort: Pulmonary effort is normal. No respiratory distress.     Breath sounds: No wheezing.  Musculoskeletal:     Comments: Right knee without significant effusion as compared to left.  Moderate crepitus bilaterally.  Patient able to bear weight with mild antalgia, favoring right.  Patient feels this is chronic/stable.  Neurovascularly intact. Lumbar spine with mild left paraspinal edema extending over spinous process.  Tender over area of edema: No warmth, fluctuance, erythema, ecchymosis, crepitus.  Patient has full active ROM of lumbar spine with pain reproduced when bringing himself to full standing.  Denies radiation of pain throughout exam.  No PSIS tenderness.  Skin:    General: Skin is warm.     Coloration: Skin is not jaundiced.     Findings: No bruising.  Neurological:     General: No focal deficit present.     Mental Status: He is alert.     Sensory: No sensory deficit.     Deep Tendon Reflexes: Reflexes normal.      UC Treatments / Results  Labs (all labs ordered are listed, but only abnormal results are displayed) Labs Reviewed - No data to display  EKG   Radiology No results found.  Procedures Procedures (including critical care time)  Medications Ordered in UC Medications - No data to display  Initial Impression / Assessment and Plan / UC Course  I have reviewed the triage vital signs and the nursing notes.  Pertinent labs & imaging results that were available during my care of the patient were reviewed by me and considered in my medical decision making (see chart for details).     Pain is acute on chronic after knee catching and back twisting.  No fall, external mechanism of injury in conjunction with reassuring exam: Likely musculoskeletal strain: Radiography deferred at this  time.  Will treat supportively as outlined below, provided cyclobenzaprine for severe muscle pain/spasm.  Will avoid heavy lifting x1 week (work note provided), follow-up with Ortho/sports med if needed.  Return precautions discussed, patient verbalized understanding and is agreeable to plan. Final Clinical Impressions(s) / UC Diagnoses   Final diagnoses:  Low back strain,  initial encounter  Acute pain of right knee     Discharge Instructions     Take muscle relaxer as needed for severe pain, spasm. May ice, rest, elevate the area(s) of pain.  You may also use hot compresses/warm wash rags to relieve muscle tightness. May use OTC Tylenol, ibuprofen as needed for pain. Return if you develop worsening pain, chest pain, difficulty breathing, holding your bowels or urine.    ED Prescriptions    Medication Sig Dispense Auth. Provider   cyclobenzaprine (FLEXERIL) 5 MG tablet Take 1 tablet (5 mg total) by mouth 2 (two) times daily as needed for muscle spasms. 14 tablet Hall-Potvin, [place], PA-C     PDMP not reviewed this encounter.   Hall-Potvin, [place], [place] 03/02/19 1756

## 2019-07-19 ENCOUNTER — Ambulatory Visit
Admission: EM | Admit: 2019-07-19 | Discharge: 2019-07-19 | Disposition: A | Discharge status: Home / Self Care | Payer: Federal, State, Local not specified - PPO | Attending: Physician Assistant | Admitting: Physician Assistant

## 2019-07-19 DIAGNOSIS — M79672 Pain in left foot: Secondary | ICD-10-CM | POA: Diagnosis not present

## 2019-07-19 DIAGNOSIS — I10 Essential (primary) hypertension: Secondary | ICD-10-CM | POA: Diagnosis not present

## 2019-07-19 DIAGNOSIS — E119 Type 2 diabetes mellitus without complications: Secondary | ICD-10-CM | POA: Diagnosis not present

## 2019-07-19 MED ORDER — INDOMETHACIN 50 MG PO CAPS: 50.0000 mg | ORAL_CAPSULE | Freq: Three times a day (TID) | ORAL | 0 refills | Status: DC

## 2019-07-19 NOTE — ED Provider Notes (Signed)
[name]    CSN: [id number] Arrival date & time: 07/19/19  1829      History   Chief Complaint Chief Complaint  Patient presents with  . Gout    HPI Kelly Valdez is a 59 y.o. male.   59 year old male comes in for 1 week history of left foot pain.  Denies injury/trauma.  States pain was to the left great toe with swelling, erythema, warmth.  History of gout, and thought it was due to this.  States he was resting, and elevating the foot, but symptoms has now traveled to the lateral foot.  States has been not needed medicine for gout in the past, and usually resolves on own in the few days.  Given the pain to the lateral foot, without improvement after a week, therefore came in for evaluation.  Erythema, warmth has resolved, but continues with swelling to left great toe.  Pain is worse with ambulation, and worse first thing in the morning.  Has numbness and tingling to the left great toe.  Denies fever, chills, body aches.  Has not taken anything for the pain.  Patient with history of non-insulin-dependent diabetes, unsure last A1c, but thinks diabetes is well controlled.  States on Metformin, and has not had changes in dosage recently.  [facility]  Denies history of diabetic neuropathy.     Past Medical History:  Diagnosis Date  . Anxiety   . Arthritis   . Chronic back pain   . Chronic headache   . Chronic knee pain    right knee  . Depression   . Diabetes mellitus without complication ([facility]) 2010   type II  . ED (erectile dysfunction)   . Hypertension 2010    Patient Active Problem List   Diagnosis Date Noted  . Essential hypertension, benign 12/15/2014  . Type 2 diabetes mellitus ([facility]) 12/15/2014    History reviewed. No pertinent surgical history.     Home Medications    Prior to Admission medications   Medication Sig Start Date End Date Taking? Authorizing Provider  indomethacin (INDOCIN) 50 MG capsule Take 1 capsule (50 mg  total) by mouth 3 (three) times daily with meals. 07/19/19   Cathie Hoops, Sreshta Cressler V, PA-C  lisinopril (ZESTRIL) 10 MG tablet Take 10 mg by mouth daily. Unsure of dose    [provider]  metFORMIN (GLUCOPHAGE) 500 MG tablet Take 1 tablet (500 mg total) by mouth 2 (two) times daily with a meal. 10/01/11 12/15/14  Elvina Sidle, MD  traZODone (DESYREL) 50 MG tablet Take 50 mg by mouth at bedtime.  03/02/19  [provider]    Family History History reviewed. No pertinent family history.  Social History Social History   Tobacco Use  . Smoking status: Never Smoker  . Smokeless tobacco: Never Used  Substance Use Topics  . Alcohol use: Yes    Alcohol/week: 2.0 standard drinks    Types: 2 Cans of beer per week    Comment: drink every night   . Drug use: No     Allergies   Patient has no known allergies.   Review of Systems Review of Systems  Reason unable to perform ROS: See HPI as above.     Physical Exam Triage Vital Signs ED Triage Vitals  Enc Vitals Group     BP 07/19/19 0927 (!) 143/93     Pulse Rate 07/19/19 0927 100     Resp 07/19/19 0927 16  Temp 07/19/19 0927 99.1 F (37.3 C)     Temp Source 07/19/19 0927 Oral     SpO2 07/19/19 0927 96 %     Weight --      Height --      Head Circumference --      Peak Flow --      Pain Score 07/19/19 0928 10     Pain Loc --      Pain Edu? --      Excl. in [place]? --    No data found.  Updated Vital Signs BP (!) 143/93 (BP Location: Left Arm)   Pulse 100   Temp 99.1 F (37.3 C) (Oral)   Resp 16   SpO2 96%   Physical Exam Constitutional:      General: He is not in acute distress.    Appearance: Normal appearance. He is well-developed. He is not toxic-appearing or diaphoretic.  HENT:     Head: Normocephalic and atraumatic.  Eyes:     Conjunctiva/sclera: Conjunctivae normal.     Pupils: Pupils are equal, round, and reactive to light.  Pulmonary:     Effort: Pulmonary effort is normal. No respiratory  distress.     Comments: Speaking in full sentences without difficulty Musculoskeletal:     Cervical back: Normal range of motion and neck supple.     Comments: Swelling to the left 1st MTP joint without erythema, warmth. Tenderness to palpation of left great toe and along 5th MTP. No tenderness to the ankle. Full ROM of the ankle/toes. Strength 5/5 BLE. Sensation intact. Pedal pulse 2+  Skin:    General: Skin is warm and dry.  Neurological:     Mental Status: He is alert and oriented to person, place, and time.      UC Treatments / Results  Labs (all labs ordered are listed, but only abnormal results are displayed) Labs Reviewed - No data to display  EKG   Radiology No results found.  Procedures Procedures (including critical care time)  Medications Ordered in UC Medications - No data to display  Initial Impression / Assessment and Plan / UC Course  I have reviewed the triage vital signs and the nursing notes.  Pertinent labs & imaging results that were available during my care of the patient were reviewed by me and considered in my medical decision making (see chart for details).    Patient with history of non-insulin dependent diabetes, unknown last A1c, will defer prednisone at this time.  Will cover for inflammation/gout with indomethacin.  Ice compress, rest.  Return precautions given.  Patient expresses understanding and patient.  Final Clinical Impressions(s) / UC Diagnoses   Final diagnoses:  Left foot pain   ED Prescriptions    Medication Sig Dispense Auth. Provider   indomethacin (INDOCIN) 50 MG capsule Take 1 capsule (50 mg total) by mouth 3 (three) times daily with meals. 21 capsule Ok Edwards, PA-C     PDMP not reviewed this encounter.   Ok Edwards, PA-C 07/19/19 1035

## 2019-07-19 NOTE — ED Triage Notes (Signed)
Pt c/o pain, burning, and swelling to lt foot x1wk. Hx of gout

## 2019-07-19 NOTE — Discharge Instructions (Addendum)
Start indomethacin as directed. This will cover for gout as well as inflammation. Continue rest, elevation, ice compress. Follow up with PCP/orthopedics if symptoms not improving.

## 2020-04-03 ENCOUNTER — Ambulatory Visit
Admission: EM | Admit: 2020-04-03 | Discharge: 2020-04-03 | Disposition: A | Discharge status: Home / Self Care | Payer: Federal, State, Local not specified - PPO | Attending: Internal Medicine | Admitting: Internal Medicine

## 2020-04-03 DIAGNOSIS — M25571 Pain in right ankle and joints of right foot: Secondary | ICD-10-CM | POA: Diagnosis not present

## 2020-04-03 MED ORDER — DICLOFENAC SODIUM 1 % EX GEL: 2.0000 g | Freq: Three times a day (TID) | CUTANEOUS | 0 refills | Status: DC

## 2020-04-03 NOTE — ED Triage Notes (Signed)
Pt c/o rt foot pain x1 week. Denies injury. States hx of gout and feels the same.

## 2020-04-03 NOTE — ED Provider Notes (Signed)
Kelly Valdez    CSN: [id number] Arrival date & time: 04/03/20  0805      History   Chief Complaint Chief Complaint  Patient presents with  . Foot Pain    HPI Kelly Valdez is a 59 y.o. male presents today with ankle pain x 1 week, denies injuring himself. Has hx of gout and feels as if he is having a flair. The lateral ankle was swollen and very tender, but not red or hot. He does recall waking up with his R foot in pain, and wonders if he had it in a strange position. Denies past hx of injuring this ankle before.     Past Medical History:  Diagnosis Date  . Anxiety   . Arthritis   . Chronic back pain   . Chronic headache   . Chronic knee pain    right knee  . Depression   . Diabetes mellitus without complication (HCC) 2010   type II  . ED (erectile dysfunction)   . Hypertension 2010    Patient Active Problem List   Diagnosis Date Noted  . Essential hypertension, benign 12/15/2014  . Type 2 diabetes mellitus (HCC) 12/15/2014    History reviewed. No pertinent surgical history.     Home Medications    Prior to Admission medications   Medication Sig Start Date End Date Taking? Authorizing Provider  indomethacin (INDOCIN) 50 MG capsule Take 1 capsule (50 mg total) by mouth 3 (three) times daily with meals. 07/19/19   Cathie Hoops, Amy V, PA-C  lisinopril (ZESTRIL) 10 MG tablet Take 10 mg by mouth daily. Unsure of dose    [provider]  metFORMIN (GLUCOPHAGE) 500 MG tablet Take 1 tablet (500 mg total) by mouth 2 (two) times daily with a meal. 10/01/11 12/15/14  Elvina Sidle, MD  traZODone (DESYREL) 50 MG tablet Take 50 mg by mouth at bedtime.  03/02/19  [provider]    Family History No family history on file.  Social History Social History   Tobacco Use  . Smoking status: Never Smoker  . Smokeless tobacco: Never Used  Substance Use Topics  . Alcohol use: Yes    Alcohol/week: 2.0 standard drinks    Types: 2 Cans of beer per  week    Comment: drink every night   . Drug use: No     Allergies   Patient has no known allergies.   Review of Systems Review of Systems  Constitutional: Negative for fever.  Musculoskeletal: Positive for gait problem and joint swelling. Negative for myalgias.  Skin: Negative for color change and rash.  Neurological: Negative for numbness.     Physical Exam Triage Vital Signs ED Triage Vitals [04/03/20 0814]  Enc Vitals Group     BP 117/74     Pulse Rate 68     Resp 18     Temp 98.2 F (36.8 C)     Temp Source Oral     SpO2 98 %     Weight      Height      Head Circumference      Peak Flow      Pain Score 4     Pain Loc      Pain Edu?      Excl. in GC?    No data found.  Updated Vital Signs BP 117/74 (BP Location: Left Arm)   Pulse 68   Temp 98.2 F (36.8 C) (Oral)   Resp 18  SpO2 98%   Visual Acuity Right Eye Distance:   Left Eye Distance:   Bilateral Distance:    Right Eye Near:   Left Eye Near:    Bilateral Near:     Physical Exam Cardiovascular:     Pulses: Normal pulses.  Pulmonary:     Effort: Pulmonary effort is normal.  Musculoskeletal:     Comments: R ANKLE- There is no redness or heat, but mild swelling of soft tissue anterior of lateral malleolus, this ankle joint is larger than his left, has soft tissue tenderness on anterior of the lateral malleolus. The medial and lateral malleolus are not tender. Has decreased ROM due to stiffness.       UC Treatments / Results  Labs (all labs ordered are listed, but only abnormal results are displayed) Labs Reviewed - No data to display  EKG   Radiology No results found.  Procedures Procedures (including critical Valdez time)  Medications Ordered in UC Medications - No data to display  Initial Impression / Assessment and Plan / UC Course  I have reviewed the triage vital signs and the nursing notes. Has soft tissue pain on lateral ankle if unknown cause which is improving. He has  enlarged ankle joint and I suspect he may have arthritis in this ankle. I placed him on Voltaren gel to use tid for 5-7 days  Final Clinical Impressions(s) / UC Diagnoses   Final diagnoses:  None   Discharge Instructions   None    ED Prescriptions    None     PDMP not reviewed this encounter.   Garey Ham, PA-C 04/03/20 1203

## 2020-04-03 NOTE — Discharge Instructions (Signed)
You seem to have inflammation of your ankle ligaments and the voltaren gel should help get this better.

## 2020-09-27 ENCOUNTER — Encounter: Payer: Self-pay | Admitting: Emergency Medicine

## 2020-09-27 ENCOUNTER — Other Ambulatory Visit: Payer: Self-pay

## 2020-09-27 ENCOUNTER — Ambulatory Visit
Admission: EM | Admit: 2020-09-27 | Discharge: 2020-09-27 | Disposition: A | Discharge status: Home / Self Care | Payer: Federal, State, Local not specified - PPO | Attending: Family Medicine | Admitting: Family Medicine

## 2020-09-27 ENCOUNTER — Ambulatory Visit (INDEPENDENT_AMBULATORY_CARE_PROVIDER_SITE_OTHER): Payer: Federal, State, Local not specified - PPO

## 2020-09-27 DIAGNOSIS — M25571 Pain in right ankle and joints of right foot: Secondary | ICD-10-CM | POA: Diagnosis not present

## 2020-09-27 DIAGNOSIS — S99911A Unspecified injury of right ankle, initial encounter: Secondary | ICD-10-CM

## 2020-09-27 MED ORDER — DICLOFENAC SODIUM 75 MG PO TBEC: 75.0000 mg | DELAYED_RELEASE_TABLET | Freq: Two times a day (BID) | ORAL | 0 refills | Status: DC

## 2020-09-27 NOTE — ED Provider Notes (Signed)
[facility]   [id number] 09/27/20 Arrival Time: 0932  ASSESSMENT & PLAN:  1. Acute right ankle pain    I have personally viewed the imaging studies ordered this visit. No appreciable fracture; question slight irregularity along lateral talus.  DG Ankle Complete Right  Result Date: 09/27/2020 CLINICAL DATA:  Lateral pain.  Status post injury. EXAM: RIGHT ANKLE - COMPLETE 3+ VIEW COMPARISON:  None. FINDINGS: There is no evidence of fracture, dislocation, or joint effusion. There is no evidence of arthropathy or other focal bone abnormality. Soft tissues are unremarkable. IMPRESSION: Negative. Electronically Signed   By: Gerome Sam III M.D   On: 09/27/2020 10:30   See AVS for ankle sprain d/c instructions. Work note provided.  Begin: Meds ordered this encounter  Medications  . diclofenac (VOLTAREN) 75 MG EC tablet    Sig: Take 1 tablet (75 mg total) by mouth 2 (two) times daily.    Dispense:  14 tablet    Refill:  0    Orders Placed This Encounter  Procedures  . DG Ankle Complete Right  . Apply ASO lace-up ankle brace    Recommend:  Follow-up Information    [facility].   Why: If worsening or failing to improve as anticipated. Contact information: [address] [place] [address] [phone]             WBAT.  Reviewed expectations re: course of current medical issues. Questions answered. Outlined signs and symptoms indicating need for more acute intervention. Patient verbalized understanding. After Visit Summary given.  SUBJECTIVE: History from: patient. Kelly Valdez is a 60 y.o. male who reports fairly persistent mild to moderate pain of his right ankle; described as aching; without radiation. Onset: abrupt. First noted: yesterday. Injury/trama: reports inversion injury on stairs; able to bear weight with soreness. Symptoms have progressed to a point and plateaued since  beginning. Aggravating factors: weight bearing and prolonged walking/standing. Alleviating factors: rest. Associated symptoms: none reported. Extremity sensation changes or weakness: none.  History reviewed. No pertinent surgical history.    OBJECTIVE:  Vitals:   09/27/20 0949  BP: 134/83  Pulse: 68  Resp: 18  Temp: 98 F (36.7 C)  TempSrc: Oral  SpO2: 97%    General appearance: alert; no distress HEENT: [place]; AT Neck: supple with FROM Resp: unlabored respirations Extremities: . RLE: warm with well perfused appearance; poorly localized moderate tenderness over right lateral anke; without gross deformities; swelling: minimal over medial ankle; bruising: none; ankle ROM: normal CV: brisk extremity capillary refill of RLE; 2+ DP pulse of RLE. Skin: warm and dry; no visible rashes Neurologic: gait normal; normal sensation and strength of RLE Psychological: alert and cooperative; normal mood and affect  Imaging: DG Ankle Complete Right  Result Date: 09/27/2020 CLINICAL DATA:  Lateral pain.  Status post injury. EXAM: RIGHT ANKLE - COMPLETE 3+ VIEW COMPARISON:  None. FINDINGS: There is no evidence of fracture, dislocation, or joint effusion. There is no evidence of arthropathy or other focal bone abnormality. Soft tissues are unremarkable. IMPRESSION: Negative. Electronically Signed   By: Gerome Sam III M.D   On: 09/27/2020 10:30      No Known Allergies  Past Medical History:  Diagnosis Date  . Anxiety   . Arthritis   . Chronic back pain   . Chronic headache   . Chronic knee pain    right knee  . Depression   . Diabetes mellitus without complication (HCC) 2010  type II  . ED (erectile dysfunction)   . Hypertension 2010   Social History   Socioeconomic History  . Marital status: Married    Spouse name: Not on file  . Number of children: Not on file  . Years of education: Not on file  . Highest education level: Not on file  Occupational History  . Not on  file  Tobacco Use  . Smoking status: Never Smoker  . Smokeless tobacco: Never Used  Substance and Sexual Activity  . Alcohol use: Yes    Alcohol/week: 2.0 standard drinks    Types: 2 Cans of beer per week    Comment: drink every night   . Drug use: No  . Sexual activity: Not on file  Other Topics Concern  . Not on file  Social History Narrative  . Not on file   Social Determinants of Health   Financial Resource Strain: Not on file  Food Insecurity: Not on file  Transportation Needs: Not on file  Physical Activity: Not on file  Stress: Not on file  Social Connections: Not on file   History reviewed. No pertinent family history. History reviewed. No pertinent surgical history.    Mardella Layman, MD 09/27/20 1043

## 2020-09-27 NOTE — ED Triage Notes (Signed)
Pt here for right ankle pain after twisting while walking down stairs yesterday; pt sts swollen last night

## 2020-10-02 ENCOUNTER — Ambulatory Visit (INDEPENDENT_AMBULATORY_CARE_PROVIDER_SITE_OTHER): Payer: Federal, State, Local not specified - PPO

## 2020-10-02 ENCOUNTER — Encounter: Payer: Self-pay | Admitting: *Deleted

## 2020-10-02 ENCOUNTER — Ambulatory Visit
Admission: EM | Admit: 2020-10-02 | Discharge: 2020-10-02 | Disposition: A | Discharge status: Home / Self Care | Payer: Federal, State, Local not specified - PPO | Attending: Student | Admitting: Student

## 2020-10-02 ENCOUNTER — Other Ambulatory Visit: Payer: Self-pay

## 2020-10-02 DIAGNOSIS — M25521 Pain in right elbow: Secondary | ICD-10-CM | POA: Diagnosis not present

## 2020-10-02 DIAGNOSIS — W19XXXA Unspecified fall, initial encounter: Secondary | ICD-10-CM | POA: Diagnosis not present

## 2020-10-02 DIAGNOSIS — S5001XA Contusion of right elbow, initial encounter: Secondary | ICD-10-CM

## 2020-10-02 NOTE — ED Provider Notes (Signed)
[name]    CSN: [id number] Arrival date & time: 10/02/20  0804      History   Chief Complaint Chief Complaint  Patient presents with  . Elbow Pain    HPI Kelly Valdez is a 60 y.o. male presenting with right elbow pain for 3 days.  Medical history arthritis, chronic back pain, chronic knee pain, diabetes, hypertension.  Surgery on left elbow about 1 year ago, patient states this is related to arthritis.  States he was doing some yard work and tripped over a cord, falling onto concrete.  all of his weight fell onto the right elbow.  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  Ice and voltaren providing some relief.  Denies numbness and tingling.  Denies injury elsewhere.  Denies head trauma, loss of consciousness, headaches, dizziness, abdominal pain, change in bowel or bladder function.  HPI  Past Medical History:  Diagnosis Date  . Anxiety   . Arthritis   . Chronic back pain   . Chronic headache   . Chronic knee pain    right knee  . Depression   . Diabetes mellitus without complication (HCC) 2010   type II  . ED (erectile dysfunction)   . Hypertension 2010    Patient Active Problem List   Diagnosis Date Noted  . Essential hypertension, benign 12/15/2014  . Type 2 diabetes mellitus (HCC) 12/15/2014    History reviewed. No pertinent surgical history.     Home Medications    Prior to Admission medications   Medication Sig Start Date End Date Taking? Authorizing Provider  diclofenac (VOLTAREN) 75 MG EC tablet Take 1 tablet (75 mg total) by mouth 2 (two) times daily. 09/27/20  Yes Hagler, Arlys John, MD  lisinopril (ZESTRIL) 10 MG tablet Take 10 mg by mouth daily. Unsure of dose   Yes [provider]  metFORMIN (GLUCOPHAGE) 500 MG tablet Take 1 tablet (500 mg total) by mouth 2 (two) times daily with a meal. 10/01/11 12/15/14  Elvina Sidle, MD  traZODone (DESYREL) 50 MG  tablet Take 50 mg by mouth at bedtime.  03/02/19  [provider]    Family History No family history on file.  Social History Social History   Tobacco Use  . Smoking status: Never Smoker  . Smokeless tobacco: Never Used  Substance Use Topics  . Alcohol use: Yes    Alcohol/week: 2.0 standard drinks    Types: 2 Cans of beer per week    Comment: drink every night   . Drug use: No     Allergies   Patient has no known allergies.   Review of Systems Review of Systems  Musculoskeletal:       R arm pain and swelling  All other systems reviewed and are negative.    Physical Exam Triage Vital Signs ED Triage Vitals [10/02/20 0816]  Enc Vitals Group     BP 133/69     Pulse Rate 71     Resp 18     Temp 99.2 F (37.3 C)     Temp Source Oral     SpO2 96 %     Weight      Height      Head Circumference      Peak Flow      Pain Score 6     Pain Loc      Pain Edu?      Excl.  in GC?    No data found.  Updated Vital Signs BP 133/69 (BP Location: Left Arm)   Pulse 71   Temp 99.2 F (37.3 C) (Oral)   Resp 18   SpO2 96%   Visual Acuity Right Eye Distance:   Left Eye Distance:   Bilateral Distance:    Right Eye Near:   Left Eye Near:    Bilateral Near:     Physical Exam Vitals reviewed.  Constitutional:      General: He is not in acute distress.    Appearance: Normal appearance. He is not ill-appearing.  HENT:     Head: Normocephalic and atraumatic.  Cardiovascular:     Rate and Rhythm: Normal rate and regular rhythm.     Heart sounds: Normal heart sounds.  Pulmonary:     Effort: Pulmonary effort is normal.     Breath sounds: Normal breath sounds and air entry.  Abdominal:     Tenderness: There is no abdominal tenderness. There is no right CVA tenderness, left CVA tenderness, guarding or rebound.     Comments: No abd pain  Musculoskeletal:     Cervical back: Normal range of motion. No swelling, deformity, signs of trauma, rigidity, spasms,  tenderness, bony tenderness or crepitus. No pain with movement.     Thoracic back: No swelling, deformity, signs of trauma, spasms, tenderness or bony tenderness. Normal range of motion. No scoliosis.     Lumbar back: No swelling, deformity, signs of trauma, spasms, tenderness or bony tenderness. Normal range of motion. Negative right straight leg raise test and negative left straight leg raise test. No scoliosis.     Comments: Right elbow with tenderness, swelling, effusion over olecranon.  Some effusion over medial epicondyle, without tenderness.  Range of motion intact, strength out of 5.  Tenderness elicited with pronation.  Grip strength 5 out of 5.  Sensation intact.  Cap refills in 2 seconds, radial pulse 2+.  Neurovascularly intact.  Absolutely no other injury, deformity, tenderness, ecchymosis, abrasion.  Neurological:     General: No focal deficit present.     Mental Status: He is alert.     Cranial Nerves: No cranial nerve deficit.  Psychiatric:        Mood and Affect: Mood normal.        Behavior: Behavior normal.        Thought Content: Thought content normal.        Judgment: Judgment normal.      UC Treatments / Results  Labs (all labs ordered are listed, but only abnormal results are displayed) Labs Reviewed - No data to display  EKG   Radiology DG Elbow Complete Right  Result Date: 10/02/2020 CLINICAL DATA:  Right elbow pain after fall 3 days ago. EXAM: RIGHT ELBOW - COMPLETE 3+ VIEW COMPARISON:  None. FINDINGS: There is no evidence of fracture, dislocation, or joint effusion. There is no evidence of arthropathy. Large osteophyte is seen arising from the olecranon with overlying soft tissue swelling. IMPRESSION: No fracture is noted. Soft tissue swelling is seen overlying the olecranon. Electronically Signed   By: Lupita Raider M.D.   On: 10/02/2020 08:56    Procedures Procedures (including critical care time)  Medications Ordered in UC Medications - No data to  display  Initial Impression / Assessment and Plan / UC Course  I have reviewed the triage vital signs and the nursing notes.  Pertinent labs & imaging results that were available during my care of the patient  were reviewed by me and considered in my medical decision making (see chart for details).     This patient is a 60 year old male presenting with R olecranon contusion following fall. Neurovascularly intact.   Xray R elbow- No fracture is noted. Soft tissue swelling is seen overlying the olecranon.  Reassurance provided. Ace wrap, RICE, f/u with ortho if symptoms persist.   ED return precautions discussed.   Final Clinical Impressions(s) / UC Diagnoses   Final diagnoses:  Contusion of right elbow, initial encounter     Discharge Instructions     -Keep your elbow brace on while you are having pain for about 1 week, or longer if symptoms persist. -Tylenol/ibuprofen, ice, rest. -Avoid movements that cause pain.  This may mean you need to stay out of work for 3 days or longer.  Note provided. -If symptoms persist, follow-up with the orthopedist that you saw for your left elbow issue.    ED Prescriptions    None     PDMP not reviewed this encounter.   Rhys Martini, PA-C 10/02/20 [phone]

## 2020-10-02 NOTE — Discharge Instructions (Addendum)
-  Keep your elbow brace on while you are having pain for about 1 week, or longer if symptoms persist. -Tylenol/ibuprofen, ice, rest. -Avoid movements that cause pain.  This may mean you need to stay out of work for 3 days or longer.  Note provided. -If symptoms persist, follow-up with the orthopedist that you saw for your left elbow issue.

## 2020-10-02 NOTE — ED Triage Notes (Signed)
Patient presents c/o right elbow pain.  Reports mechanical fall Sunday, fell on concrete, caught himself with his elbow.      Reports swelling and pain this AM.   Reports ice at home with some relief.

## 2020-11-22 ENCOUNTER — Other Ambulatory Visit: Payer: Self-pay

## 2020-11-22 ENCOUNTER — Ambulatory Visit
Admission: EM | Admit: 2020-11-22 | Discharge: 2020-11-22 | Disposition: A | Discharge status: Home / Self Care | Payer: Federal, State, Local not specified - PPO | Attending: Physician Assistant | Admitting: Physician Assistant

## 2020-11-22 ENCOUNTER — Ambulatory Visit (INDEPENDENT_AMBULATORY_CARE_PROVIDER_SITE_OTHER): Payer: Federal, State, Local not specified - PPO

## 2020-11-22 ENCOUNTER — Encounter: Payer: Self-pay | Admitting: Emergency Medicine

## 2020-11-22 DIAGNOSIS — M25572 Pain in left ankle and joints of left foot: Secondary | ICD-10-CM

## 2020-11-22 DIAGNOSIS — S96912A Strain of unspecified muscle and tendon at ankle and foot level, left foot, initial encounter: Secondary | ICD-10-CM

## 2020-11-22 NOTE — ED Provider Notes (Signed)
[name]    CSN: [id number] Arrival date & time: 11/22/20  1410      History   Chief Complaint Chief Complaint  Patient presents with   Ankle Pain    HPI Kelly Valdez is a 60 y.o. male.   Patient presents today with a several hour history of left ankle pain.  Reports that he was walking down the stairs at his employer when he felt a pop and sudden severe pain in his left ankle.  He denies rolling or taking a misstep of his ankle.  He denies previous injury or surgery on this ankle.  He reports pain has gradually subsided and is currently rated 6/7 on a 0-10 pain scale, localized to lateral left ankle without radiation, described as throbbing, worse with ambulation, no alleviating factors identified.  He has not tried any over-the-counter medications for symptom management.  He has been ambulating with a cane since injury and has found this provides some improvement of symptoms.  He denies any numbness or paresthesias.   Past Medical History:  Diagnosis Date   Anxiety    Arthritis    Chronic back pain    Chronic headache    Chronic knee pain    right knee   Depression    Diabetes mellitus without complication (HCC) 2010   type II   ED (erectile dysfunction)    Hypertension 2010    Patient Active Problem List   Diagnosis Date Noted   Essential hypertension, benign 12/15/2014   Type 2 diabetes mellitus (HCC) 12/15/2014    History reviewed. No pertinent surgical history.     Home Medications    Prior to Admission medications   Medication Sig Start Date End Date Taking? Authorizing Provider  diclofenac (VOLTAREN) 75 MG EC tablet Take 1 tablet (75 mg total) by mouth 2 (two) times daily. 09/27/20   Mardella Layman, MD  lisinopril (ZESTRIL) 10 MG tablet Take 10 mg by mouth daily. Unsure of dose    [provider]  metFORMIN (GLUCOPHAGE) 500 MG tablet Take 1 tablet (500 mg total) by mouth 2 (two) times daily with a meal. 10/01/11 12/15/14   Elvina Sidle, MD  traZODone (DESYREL) 50 MG tablet Take 50 mg by mouth at bedtime.  03/02/19  [provider]    Family History History reviewed. No pertinent family history.  Social History Social History   Tobacco Use   Smoking status: Never   Smokeless tobacco: Never  Substance Use Topics   Alcohol use: Yes    Alcohol/week: 2.0 standard drinks    Types: 2 Cans of beer per week    Comment: drink every night    Drug use: No     Allergies   Patient has no known allergies.   Review of Systems Review of Systems  Constitutional:  Positive for activity change. Negative for appetite change, fatigue and fever.  Respiratory:  Negative for cough and shortness of breath.   Cardiovascular:  Negative for chest pain and leg swelling.  Musculoskeletal:  Positive for arthralgias, gait problem and joint swelling. Negative for myalgias.  Neurological:  Negative for dizziness, weakness, light-headedness, numbness and headaches.    Physical Exam Triage Vital Signs ED Triage Vitals  Enc Vitals Group     BP 11/22/20 1429 107/71     Pulse Rate 11/22/20 1429 76     Resp 11/22/20 1429 18     Temp 11/22/20 1429 98 F (36.7 C)     Temp Source 11/22/20  1429 Oral     SpO2 11/22/20 1429 95 %     Weight --      Height --      Head Circumference --      Peak Flow --      Pain Score 11/22/20 1427 8     Pain Loc --      Pain Edu? --      Excl. in GC? --    No data found.  Updated Vital Signs BP 107/71 (BP Location: Left Arm)   Pulse 76   Temp 98 F (36.7 C) (Oral)   Resp 18   SpO2 95%   Visual Acuity Right Eye Distance:   Left Eye Distance:   Bilateral Distance:    Right Eye Near:   Left Eye Near:    Bilateral Near:     Physical Exam Vitals reviewed.  Constitutional:      General: He is awake.     Appearance: Normal appearance. He is normal weight. He is not ill-appearing.     Comments: Very pleasant male appears stated age in no acute distress sitting  comfortably in exam room  HENT:     Head: Normocephalic and atraumatic.  Cardiovascular:     Rate and Rhythm: Normal rate and regular rhythm.     Pulses:          Posterior tibial pulses are 2+ on the right side and 2+ on the left side.     Heart sounds: Normal heart sounds, S1 normal and S2 normal. No murmur heard. Pulmonary:     Effort: Pulmonary effort is normal.     Breath sounds: Normal breath sounds. No stridor. No wheezing, rhonchi or rales.     Comments: Clear to auscultation bilaterally Musculoskeletal:     Left ankle: Swelling present. No deformity. Tenderness present over the lateral malleolus. Decreased range of motion.     Left Achilles Tendon: No tenderness. Thompson's test negative.     Comments: Left ankle: Normal active range of motion but with pain at dorsiflexion and plantar flexion.  No deformity noted.  Negative Thompson test.  Foot neurovascularly intact.  Neurological:     Mental Status: He is alert.  Psychiatric:        Behavior: Behavior is cooperative.     UC Treatments / Results  Labs (all labs ordered are listed, but only abnormal results are displayed) Labs Reviewed - No data to display  EKG   Radiology DG Ankle Complete Left  Result Date: 11/22/2020 CLINICAL DATA:  Left ankle pain without known injury. EXAM: LEFT ANKLE COMPLETE - 3+ VIEW COMPARISON:  None. FINDINGS: There is no evidence of fracture, dislocation, or joint effusion. There is no evidence of arthropathy or other focal bone abnormality. Soft tissues are unremarkable. IMPRESSION: Negative. Electronically Signed   By: Lupita Raider M.D.   On: 11/22/2020 15:03    Procedures Procedures (including critical care time)  Medications Ordered in UC Medications - No data to display  Initial Impression / Assessment and Plan / UC Course  I have reviewed the triage vital signs and the nursing notes.  Pertinent labs & imaging results that were available during my care of the patient were  reviewed by me and considered in my medical decision making (see chart for details).      X-ray obtained given tenderness at lateral malleolus was negative.  Suspect ankle sprain as etiology of symptoms.  Patient was placed in ankle brace and encouraged to use  RICE protocol.  He can alternate Tylenol and ibuprofen over-the-counter.  He was given contact information for local orthopedic provider should symptoms persist.  Discussed alarm symptoms that warrant emergent evaluation.  Strict return precautions given to patient expressed understanding.  Final Clinical Impressions(s) / UC Diagnoses   Final diagnoses:  Acute left ankle pain  Strain of left ankle, initial encounter     Discharge Instructions      There was no break on your x-ray.  I believe that you sprained your ankle.  Please use brace when trying to walk around.  Keep your leg elevated and use ice as well as compression for additional symptom relief.  If your symptoms are not improving I would follow-up with orthopedic provider as we discussed.  If you have any worsening symptoms you need to be reevaluated.     ED Prescriptions   None    PDMP not reviewed this encounter.   Jeani Hawking, PA-C 11/22/20 1520

## 2020-11-22 NOTE — Discharge Instructions (Signed)
There was no break on your x-ray.  I believe that you sprained your ankle.  Please use brace when trying to walk around.  Keep your leg elevated and use ice as well as compression for additional symptom relief.  If your symptoms are not improving I would follow-up with orthopedic provider as we discussed.  If you have any worsening symptoms you need to be reevaluated.

## 2020-11-22 NOTE — ED Triage Notes (Signed)
Pt presents today with c/o of left ankle pain. He reports he was walking while at work and felt a pop in his ankle causing pain. Denies injury. Ambulatory with use of cane.

## 2020-12-13 ENCOUNTER — Other Ambulatory Visit: Payer: Self-pay

## 2020-12-13 ENCOUNTER — Ambulatory Visit
Admission: EM | Admit: 2020-12-13 | Discharge: 2020-12-13 | Disposition: A | Discharge status: Home / Self Care | Payer: Federal, State, Local not specified - PPO

## 2020-12-13 DIAGNOSIS — B349 Viral infection, unspecified: Secondary | ICD-10-CM | POA: Diagnosis not present

## 2020-12-13 DIAGNOSIS — R059 Cough, unspecified: Secondary | ICD-10-CM

## 2020-12-13 DIAGNOSIS — R61 Generalized hyperhidrosis: Secondary | ICD-10-CM

## 2020-12-13 DIAGNOSIS — I10 Essential (primary) hypertension: Secondary | ICD-10-CM

## 2020-12-13 DIAGNOSIS — R52 Pain, unspecified: Secondary | ICD-10-CM

## 2020-12-13 DIAGNOSIS — R1030 Lower abdominal pain, unspecified: Secondary | ICD-10-CM

## 2020-12-13 DIAGNOSIS — E119 Type 2 diabetes mellitus without complications: Secondary | ICD-10-CM

## 2020-12-13 MED ORDER — BENZONATATE 100 MG PO CAPS
100.0000 mg | ORAL_CAPSULE | Freq: Three times a day (TID) | ORAL | 0 refills | Status: DC | PRN
Start: 2020-12-13 — End: 2021-07-22

## 2020-12-13 MED ORDER — PROMETHAZINE-DM 6.25-15 MG/5ML PO SYRP: 5.0000 mL | ORAL_SOLUTION | Freq: Every evening | ORAL | 0 refills | Status: DC | PRN

## 2020-12-13 NOTE — ED Provider Notes (Signed)
[facility]   MRN: [id number] DOB: 1960-09-07  Subjective:   Kelly Valdez is a 60 y.o. male presenting for acute onset this morning of malaise, chills, body aches, sweating, loose stools.  He had an aggressive coughing fit earlier and has since had lower abdominal pain that is mild in nature.  Denies fever, chest pain, shortness of breath, bloody stools, diarrhea.  He is a diabetic treated without insulin.  Also has hypertension.  He is not a smoker.  No history of respiratory disorders.  He is COVID vaccinated but no booster.  No current facility-administered medications for this encounter.  Current Outpatient Medications:    diclofenac Sodium (VOLTAREN) 1 % GEL, APPLY 4 GRAMS TO AFFECTED AREA EVERY 6 HOURS, Disp: , Rfl:    diclofenac (VOLTAREN) 75 MG EC tablet, Take 1 tablet (75 mg total) by mouth 2 (two) times daily., Disp: 14 tablet, Rfl: 0   lisinopril (ZESTRIL) 10 MG tablet, Take 10 mg by mouth daily. Unsure of dose, Disp: , Rfl:    metFORMIN (GLUCOPHAGE) 500 MG tablet, Take 1 tablet (500 mg total) by mouth 2 (two) times daily with a meal., Disp: 180 tablet, Rfl: 3   No Known Allergies  Past Medical History:  Diagnosis Date   Anxiety    Arthritis    Chronic back pain    Chronic headache    Chronic knee pain    right knee   Depression    Diabetes mellitus without complication (HCC) 2010   type II   ED (erectile dysfunction)    Hypertension 2010     History reviewed. No pertinent surgical history.  History reviewed. No pertinent family history.  Social History   Tobacco Use   Smoking status: Never   Smokeless tobacco: Never  Substance Use Topics   Alcohol use: Yes    Alcohol/week: 2.0 standard drinks    Types: 2 Cans of beer per week    Comment: drink every night    Drug use: No    ROS   Objective:   Vitals: BP 135/89 (BP Location: Left Arm)   Pulse 68   Temp 98.5 F (36.9 C) (Oral)   Resp 18   SpO2 96%   Physical  Exam Constitutional:      General: He is not in acute distress.    Appearance: Normal appearance. He is well-developed. He is not ill-appearing, toxic-appearing or diaphoretic.  HENT:     Head: Normocephalic and atraumatic.     Right Ear: External ear normal.     Left Ear: External ear normal.     Nose: Nose normal.     Mouth/Throat:     Mouth: Mucous membranes are moist.     Pharynx: No oropharyngeal exudate or posterior oropharyngeal erythema.  Eyes:     General: No scleral icterus.       Right eye: No discharge.        Left eye: No discharge.     Extraocular Movements: Extraocular movements intact.     Conjunctiva/sclera: Conjunctivae normal.     Pupils: Pupils are equal, round, and reactive to light.  Cardiovascular:     Rate and Rhythm: Normal rate and regular rhythm.     Heart sounds: Normal heart sounds. No murmur heard.   No friction rub. No gallop.  Pulmonary:     Effort: Pulmonary effort is normal. No respiratory distress.     Breath sounds: Normal breath sounds. No stridor. No wheezing, rhonchi or rales.  Abdominal:  General: Bowel sounds are normal. There is no distension.     Palpations: Abdomen is soft. There is no mass.     Tenderness: There is no abdominal tenderness. There is no guarding or rebound.  Skin:    General: Skin is warm and dry.  Neurological:     Mental Status: He is alert and oriented to person, place, and time.  Psychiatric:        Mood and Affect: Mood normal.        Behavior: Behavior normal.        Thought Content: Thought content normal.        Judgment: Judgment normal.    Assessment and Plan :   PDMP not reviewed this encounter.  1. Viral syndrome   2. Cough   3. Excessive sweating   4. Lower abdominal pain   5. Body aches     Will manage for viral illness such as viral URI, viral syndrome, viral rhinitis, COVID-19. Counseled patient on nature of COVID-19 including modes of transmission, diagnostic testing, management and  supportive care.  Offered scripts for symptomatic relief. COVID 19 testing is pending. Counseled patient on potential for adverse effects with medications prescribed/recommended today, ER and return-to-clinic precautions discussed, patient verbalized understanding.     Wallis Bamberg, [place] 12/13/20 1557

## 2020-12-13 NOTE — Discharge Instructions (Addendum)
We will notify you of your COVID-19 test results as they arrive and may take between 24 to 48 hours.  I encourage you to sign up for MyChart if you have not already done so as this can be the easiest way for [place] to communicate results to you online or through a phone app.  In the meantime, if you develop worsening symptoms including fever, chest pain, shortness of breath despite our current treatment plan then please report to the emergency room as this may be a sign of worsening status from possible COVID-19 infection.  Otherwise, we will manage this as a viral syndrome. For sore throat or cough try using a honey-based tea. Use 3 teaspoons of honey with juice squeezed from half lemon. Place shaved pieces of ginger into 1/2-1 cup of water and warm over stove top. Then mix the ingredients and repeat every 4 hours as needed. Please take Tylenol 500mg -650mg  every 6 hours for aches and pains, fevers. Hydrate very well with at least 2 liters of water. Eat light meals such as soups to replenish electrolytes and soft fruits, veggies. Start an antihistamine like Zyrtec, Allegra or Claritin for postnasal drainage, sinus congestion.  You can take this together with pseudoephedrine (Sudafed) at a dose of 60 mg 2-3 times a day as needed for the same kind of congestion.  Use the cough capsules, cough syrup to help with your cough.

## 2020-12-13 NOTE — ED Triage Notes (Signed)
Onset today of chills, intermittent abdominal pain and body aches. No meds taken. Denies diarrhea but notes 2 BM today which is abnormal for him. Pt used Voltaren gel on his knees yesterday otherwise no meds taken.

## 2020-12-14 LAB — NOVEL CORONAVIRUS, NAA: SARS-CoV-2, NAA: NOT DETECTED

## 2020-12-14 LAB — SARS-COV-2, NAA 2 DAY TAT

## 2021-01-22 ENCOUNTER — Ambulatory Visit
Admission: EM | Admit: 2021-01-22 | Discharge: 2021-01-22 | Disposition: A | Discharge status: Home / Self Care | Payer: Federal, State, Local not specified - PPO | Attending: Internal Medicine | Admitting: Internal Medicine

## 2021-01-22 ENCOUNTER — Other Ambulatory Visit: Payer: Self-pay

## 2021-01-22 DIAGNOSIS — J069 Acute upper respiratory infection, unspecified: Secondary | ICD-10-CM

## 2021-01-22 DIAGNOSIS — J029 Acute pharyngitis, unspecified: Secondary | ICD-10-CM | POA: Insufficient documentation

## 2021-01-22 DIAGNOSIS — Z20822 Contact with and (suspected) exposure to covid-19: Secondary | ICD-10-CM | POA: Insufficient documentation

## 2021-01-22 LAB — POCT RAPID STREP A (OFFICE): Rapid Strep A Screen: NEGATIVE

## 2021-01-22 MED ORDER — CETIRIZINE HCL 10 MG PO TABS: 10.0000 mg | ORAL_TABLET | Freq: Every day | ORAL | 0 refills | Status: DC

## 2021-01-22 MED ORDER — PROMETHAZINE-DM 6.25-15 MG/5ML PO SYRP: 5.0000 mL | ORAL_SOLUTION | Freq: Every evening | ORAL | 0 refills | Status: DC | PRN

## 2021-01-22 MED ORDER — FLUTICASONE PROPIONATE 50 MCG/ACT NA SUSP: 1.0000 | Freq: Every day | NASAL | 0 refills | Status: DC

## 2021-01-22 NOTE — ED Provider Notes (Signed)
[name]    CSN: [id number] Arrival date & time: 01/22/21  9381      History   Chief Complaint Chief Complaint  Patient presents with   Cough    HPI Kelly Valdez is a 60 y.o. male.   Patient presents with a 5-day history of cough, runny nose, intermittent headaches, chills, fever.  Patient not sure of T-max at home.  Denies any nausea, vomiting, diarrhea, abdominal pain.  Denies any known sick contacts.  Patient has not yet taken any over-the-counter medications for relief.  Denies any chest pain or shortness of breath.   Cough  Past Medical History:  Diagnosis Date   Anxiety    Arthritis    Chronic back pain    Chronic headache    Chronic knee pain    right knee   Depression    Diabetes mellitus without complication (HCC) 2010   type II   ED (erectile dysfunction)    Hypertension 2010    Patient Active Problem List   Diagnosis Date Noted   Essential hypertension, benign 12/15/2014   Type 2 diabetes mellitus (HCC) 12/15/2014    History reviewed. No pertinent surgical history.     Home Medications    Prior to Admission medications   Medication Sig Start Date End Date Taking? Authorizing Provider  cetirizine (ZYRTEC) 10 MG tablet Take 1 tablet (10 mg total) by mouth daily. 01/22/21  Yes Lance Muss, FNP  fluticasone (FLONASE) 50 MCG/ACT nasal spray Place 1 spray into both nostrils daily. Limit use to 3 days 01/22/21  Yes Lance Muss, FNP  benzonatate (TESSALON) 100 MG capsule Take 1-2 capsules (100-200 mg total) by mouth 3 (three) times daily as needed. 12/13/20   Wallis Bamberg, PA-C  diclofenac (VOLTAREN) 75 MG EC tablet Take 1 tablet (75 mg total) by mouth 2 (two) times daily. 09/27/20   Mardella Layman, MD  diclofenac Sodium (VOLTAREN) 1 % GEL APPLY 4 GRAMS TO AFFECTED AREA EVERY 6 HOURS 08/20/20   [provider]  lisinopril (ZESTRIL) 10 MG tablet Take 10 mg by mouth daily. Unsure of dose    [provider]  metFORMIN  (GLUCOPHAGE) 500 MG tablet Take 1 tablet (500 mg total) by mouth 2 (two) times daily with a meal. 10/01/11 12/15/14  Elvina Sidle, MD  promethazine-dextromethorphan (PROMETHAZINE-DM) 6.25-15 MG/5ML syrup Take 5 mLs by mouth at bedtime as needed for cough. 01/22/21   Lance Muss, FNP  traZODone (DESYREL) 50 MG tablet Take 50 mg by mouth at bedtime.  03/02/19  [provider]    Family History History reviewed. No pertinent family history.  Social History Social History   Tobacco Use   Smoking status: Never   Smokeless tobacco: Never  Substance Use Topics   Alcohol use: Yes    Alcohol/week: 2.0 standard drinks    Types: 2 Cans of beer per week    Comment: drink every night    Drug use: No     Allergies   Patient has no known allergies.   Review of Systems Review of Systems Per HPI  Physical Exam Triage Vital Signs ED Triage Vitals [01/22/21 0944]  Enc Vitals Group     BP (!) 162/95     Pulse Rate 67     Resp 18     Temp 98.8 F (37.1 C)     Temp Source Oral     SpO2 98 %     Weight  Height      Head Circumference      Peak Flow      Pain Score 0     Pain Loc      Pain Edu?      Excl. in GC?    No data found.  Updated Vital Signs BP (!) 162/95 (BP Location: Left Arm)   Pulse 67   Temp 98.8 F (37.1 C) (Oral)   Resp 18   SpO2 98%   Visual Acuity Right Eye Distance:   Left Eye Distance:   Bilateral Distance:    Right Eye Near:   Left Eye Near:    Bilateral Near:     Physical Exam Constitutional:      General: He is not in acute distress.    Appearance: Normal appearance.  HENT:     Head: Normocephalic and atraumatic.     Right Ear: Ear canal normal. A middle ear effusion is present. Tympanic membrane is not erythematous or bulging.     Left Ear: Ear canal normal. A middle ear effusion is present. Tympanic membrane is not erythematous or bulging.     Nose: Congestion present.     Mouth/Throat:     Mouth: Mucous membranes are  moist.     Pharynx: Posterior oropharyngeal erythema present.  Eyes:     Extraocular Movements: Extraocular movements intact.     Conjunctiva/sclera: Conjunctivae normal.     Pupils: Pupils are equal, round, and reactive to light.  Cardiovascular:     Rate and Rhythm: Normal rate and regular rhythm.     Pulses: Normal pulses.     Heart sounds: Normal heart sounds.  Pulmonary:     Effort: Pulmonary effort is normal. No respiratory distress.     Breath sounds: Normal breath sounds. No wheezing.  Abdominal:     General: Abdomen is flat. Bowel sounds are normal.     Palpations: Abdomen is soft.  Musculoskeletal:        General: Normal range of motion.     Cervical back: Normal range of motion.  Skin:    General: Skin is warm and dry.  Neurological:     General: No focal deficit present.     Mental Status: He is alert and oriented to person, place, and time. Mental status is at baseline.  Psychiatric:        Mood and Affect: Mood normal.        Behavior: Behavior normal.     UC Treatments / Results  Labs (all labs ordered are listed, but only abnormal results are displayed) Labs Reviewed  CULTURE, GROUP A STREP ([facility])  NOVEL CORONAVIRUS, NAA  POCT RAPID STREP A (OFFICE)    EKG   Radiology No results found.  Procedures Procedures (including critical care time)  Medications Ordered in UC Medications - No data to display  Initial Impression / Assessment and Plan / UC Course  I have reviewed the triage vital signs and the nursing notes.  Pertinent labs & imaging results that were available during my care of the patient were reviewed by me and considered in my medical decision making (see chart for details).     Patient presents with symptoms likely from a viral upper respiratory infection. Differential includes bacterial pneumonia, sinusitis, allergic rhinitis, Covid 19. Do not suspect underlying cardiopulmonary process. Symptoms seem unlikely related to ACS, CHF or  COPD exacerbations, pneumonia, pneumothorax. Patient is nontoxic appearing and not in need of emergent medical intervention.  Patient was prescribed  cough medication and cetirizine and Flonase to help alleviate symptoms.  Rapid strep was negative today throat culture and COVID-19 PCR pending.  Recommended symptom control with over the counter medications: Daily oral anti-histamine, Oral decongestant or IN corticosteroid, saline irrigations, cepacol lozenges, Robitussin, Delsym, honey tea.  Return if symptoms fail to improve in 1-2 weeks or you develop shortness of breath, chest pain, severe headache. Patient states understanding and is agreeable.  Discharged with PCP followup.  Final Clinical Impressions(s) / UC Diagnoses   Final diagnoses:  Viral upper respiratory tract infection with cough  Sore throat  Encounter for laboratory testing for COVID-19 virus     Discharge Instructions      You are experiencing symptoms from a viral upper respiratory infection.  You have been prescribed medications to help alleviate symptoms.  Your rapid strep was negative.  Throat culture and COVID-19 viral swab are pending.     ED Prescriptions     Medication Sig Dispense Auth. Provider   promethazine-dextromethorphan (PROMETHAZINE-DM) 6.25-15 MG/5ML syrup Take 5 mLs by mouth at bedtime as needed for cough. 100 mL Lance Muss, FNP   cetirizine (ZYRTEC) 10 MG tablet Take 1 tablet (10 mg total) by mouth daily. 30 tablet Lance Muss, FNP   fluticasone (FLONASE) 50 MCG/ACT nasal spray Place 1 spray into both nostrils daily. Limit use to 3 days 16 g Lance Muss, FNP      PDMP not reviewed this encounter.   Lance Muss, FNP 01/22/21 1027

## 2021-01-22 NOTE — ED Triage Notes (Signed)
Pt c/o cough, runny nose, headache, watery eyes, chills, fever, onset last Friday. Denies body aches, N&V, diarrhea or constipation. States he took a "sinus pill" at home without relief.

## 2021-01-22 NOTE — Discharge Instructions (Addendum)
You are experiencing symptoms from a viral upper respiratory infection.  You have been prescribed medications to help alleviate symptoms.  Your rapid strep was negative.  Throat culture and COVID-19 viral swab are pending.

## 2021-01-23 LAB — SARS-COV-2, NAA 2 DAY TAT

## 2021-01-23 LAB — NOVEL CORONAVIRUS, NAA: SARS-CoV-2, NAA: NOT DETECTED

## 2021-01-25 LAB — CULTURE, GROUP A STREP (THRC)

## 2021-03-25 ENCOUNTER — Encounter (HOSPITAL_COMMUNITY): Payer: Self-pay | Admitting: Emergency Medicine

## 2021-03-25 ENCOUNTER — Ambulatory Visit (HOSPITAL_COMMUNITY)
Admission: EM | Admit: 2021-03-25 | Discharge: 2021-03-25 | Disposition: A | Discharge status: Home / Self Care | Payer: Federal, State, Local not specified - PPO | Attending: Physician Assistant | Admitting: Physician Assistant

## 2021-03-25 ENCOUNTER — Other Ambulatory Visit: Payer: Self-pay

## 2021-03-25 DIAGNOSIS — M7021 Olecranon bursitis, right elbow: Secondary | ICD-10-CM

## 2021-03-25 MED ORDER — KETOROLAC TROMETHAMINE 30 MG/ML IJ SOLN
30.0000 mg | Freq: Once | INTRAMUSCULAR | Status: AC
  Administered 2021-03-25: 30 mg via INTRAMUSCULAR

## 2021-03-25 MED ORDER — KETOROLAC TROMETHAMINE 30 MG/ML IJ SOLN
INTRAMUSCULAR | Status: AC
  Filled 2021-03-25: qty 1

## 2021-03-25 NOTE — Discharge Instructions (Addendum)
Can take tylenol as needed [facility]  Recommend ice as needed

## 2021-03-25 NOTE — ED Triage Notes (Signed)
Patient reports chronic issues with right elbow.  Patient did hit elbow today, now having pain .  Patient unable to pick up anything this evening with hand, too painful

## 2021-05-14 ENCOUNTER — Emergency Department (HOSPITAL_COMMUNITY)
Admission: EM | Admit: 2021-05-14 | Discharge: 2021-05-14 | Discharge status: Against Medical Advice | Payer: Federal, State, Local not specified - PPO

## 2021-05-14 ENCOUNTER — Other Ambulatory Visit: Payer: Self-pay

## 2021-05-14 ENCOUNTER — Ambulatory Visit
Admission: EM | Admit: 2021-05-14 | Discharge: 2021-05-14 | Payer: Federal, State, Local not specified - PPO | Attending: Internal Medicine | Admitting: Internal Medicine

## 2021-05-14 DIAGNOSIS — Z1152 Encounter for screening for COVID-19: Secondary | ICD-10-CM

## 2021-05-14 DIAGNOSIS — R109 Unspecified abdominal pain: Secondary | ICD-10-CM | POA: Diagnosis not present

## 2021-05-14 DIAGNOSIS — R509 Fever, unspecified: Secondary | ICD-10-CM

## 2021-05-14 NOTE — ED Provider Notes (Signed)
[name]    CSN: [id number] Arrival date & time: 05/14/21  0846      History   Chief Complaint Chief Complaint  Patient presents with   Abdominal Pain   Headache    HPI Kelly Valdez is a 60 y.o. male.   Patient presents with abdominal pain, fever, nasal congestion, headache that started approximately 6 days ago.  Patient reports that his wife recently tested positive for COVID-19.  Patient is mainly concerned about the abdominal pain that he is currently having.  Patient reports the pain is rated 9/10 on pain scale and wraps all the way around his abdomen including his back.  Pain is constant and described as "sharp and crampy".  He has also had some associated "watery diarrhea".  Denies nausea or vomiting.  Denies chest pain, shortness of breath, any known fevers at home.  Patient reports that he had upper respiratory symptoms recently that have since resolved.  Has taken Tylenol with minimal improvement in symptoms.   Abdominal Pain Headache  Past Medical History:  Diagnosis Date   Anxiety    Arthritis    Chronic back pain    Chronic headache    Chronic knee pain    right knee   Depression    Diabetes mellitus without complication (HCC) 2010   type II   ED (erectile dysfunction)    Hypertension 2010    Patient Active Problem List   Diagnosis Date Noted   Essential hypertension, benign 12/15/2014   Type 2 diabetes mellitus (HCC) 12/15/2014    Past Surgical History:  Procedure Laterality Date   ELBOW BURSA SURGERY     KNEE SURGERY         Home Medications    Prior to Admission medications   Medication Sig Start Date End Date Taking? Authorizing Provider  benzonatate (TESSALON) 100 MG capsule Take 1-2 capsules (100-200 mg total) by mouth 3 (three) times daily as needed. Patient not taking: Reported on 03/25/2021 12/13/20   Wallis Bamberg, PA-C  cetirizine (ZYRTEC) 10 MG tablet Take 1 tablet (10 mg total) by mouth daily. Patient not taking:  Reported on 03/25/2021 01/22/21   Gustavus Bryant, FNP  diclofenac (VOLTAREN) 75 MG EC tablet Take 1 tablet (75 mg total) by mouth 2 (two) times daily. Patient not taking: Reported on 03/25/2021 09/27/20   Mardella Layman, MD  diclofenac Sodium (VOLTAREN) 1 % GEL APPLY 4 GRAMS TO AFFECTED AREA EVERY 6 HOURS 08/20/20   [provider]  fluticasone (FLONASE) 50 MCG/ACT nasal spray Place 1 spray into both nostrils daily. Limit use to 3 days Patient not taking: Reported on 03/25/2021 01/22/21   Gustavus Bryant, FNP  lisinopril (ZESTRIL) 10 MG tablet Take 10 mg by mouth daily. Unsure of dose    [provider]  metFORMIN (GLUCOPHAGE) 500 MG tablet Take 1 tablet (500 mg total) by mouth 2 (two) times daily with a meal. 10/01/11 12/15/14  Elvina Sidle, MD  promethazine-dextromethorphan (PROMETHAZINE-DM) 6.25-15 MG/5ML syrup Take 5 mLs by mouth at bedtime as needed for cough. Patient not taking: Reported on 03/25/2021 01/22/21   Gustavus Bryant, FNP  traZODone (DESYREL) 50 MG tablet Take 50 mg by mouth at bedtime.  03/02/19  [provider]    Family History Family History  Problem Relation Age of Onset   Healthy Mother    Healthy Father     Social History Social History   Tobacco Use   Smoking status: Never   Smokeless  tobacco: Never  Vaping Use   Vaping Use: Never used  Substance Use Topics   Alcohol use: Yes    Alcohol/week: 2.0 standard drinks    Types: 2 Cans of beer per week    Comment: drink every night    Drug use: No     Allergies   Patient has no known allergies.   Review of Systems Review of Systems Per HPI  Physical Exam Triage Vital Signs ED Triage Vitals  Enc Vitals Group     BP 05/14/21 1050 (!) 150/90     Pulse Rate 05/14/21 1050 76     Resp 05/14/21 1050 19     Temp 05/14/21 1050 (!) 100.9 F (38.3 C)     Temp Source 05/14/21 1050 Oral     SpO2 05/14/21 1050 93 %     Weight --      Height --      Head Circumference --      Peak Flow --       Pain Score 05/14/21 1048 0     Pain Loc --      Pain Edu? --      Excl. in GC? --    No data found.  Updated Vital Signs BP (!) 150/90 (BP Location: Left Arm)    Pulse 76    Temp (!) 100.9 F (38.3 C) (Oral)    Resp 19    SpO2 97% Comment: Obtained by provider  Visual Acuity Right Eye Distance:   Left Eye Distance:   Bilateral Distance:    Right Eye Near:   Left Eye Near:    Bilateral Near:     Physical Exam Constitutional:      General: He is not in acute distress.    Appearance: Normal appearance. He is not toxic-appearing or diaphoretic.  HENT:     Head: Normocephalic and atraumatic.     Right Ear: Tympanic membrane and ear canal normal.     Left Ear: Tympanic membrane and ear canal normal.     Nose: Congestion present.     Mouth/Throat:     Mouth: Mucous membranes are moist.     Pharynx: No posterior oropharyngeal erythema.  Eyes:     Extraocular Movements: Extraocular movements intact.     Conjunctiva/sclera: Conjunctivae normal.     Pupils: Pupils are equal, round, and reactive to light.  Cardiovascular:     Rate and Rhythm: Normal rate and regular rhythm.     Pulses: Normal pulses.     Heart sounds: Normal heart sounds.  Pulmonary:     Effort: Pulmonary effort is normal. No respiratory distress.     Breath sounds: Normal breath sounds. No stridor. No wheezing, rhonchi or rales.  Abdominal:     General: Abdomen is flat. Bowel sounds are normal.     Palpations: Abdomen is soft.     Tenderness: There is abdominal tenderness in the right upper quadrant, epigastric area and left upper quadrant.       Comments: Tenderness to palpation to entirety of upper abdomen.  Musculoskeletal:        General: Normal range of motion.     Cervical back: Normal and normal range of motion.     Thoracic back: Normal.     Lumbar back: Tenderness present. No swelling, edema or bony tenderness.       Back:     Comments: Tenderness to palpation to bilateral lumbar regions.   Skin:    General: Skin is  warm and dry.  Neurological:     General: No focal deficit present.     Mental Status: He is alert and oriented to person, place, and time. Mental status is at baseline.     Deep Tendon Reflexes: Reflexes are normal and symmetric.  Psychiatric:        Mood and Affect: Mood normal.        Behavior: Behavior normal.     UC Treatments / Results  Labs (all labs ordered are listed, but only abnormal results are displayed) Labs Reviewed  COVID-19, FLU A+B NAA    EKG   Radiology No results found.  Procedures Procedures (including critical care time)  Medications Ordered in UC Medications - No data to display  Initial Impression / Assessment and Plan / UC Course  I have reviewed the triage vital signs and the nursing notes.  Pertinent labs & imaging results that were available during my care of the patient were reviewed by me and considered in my medical decision making (see chart for details).     Highly suspicious that patient's symptoms could be COVID-19 related given close exposure.  Although, patient is having severe abdominal pain that has been persistent and worsening.  Patient also has fever that is present.  Advised patient that he will need to go to the hospital for further evaluation and management given setting of current severe abdominal pain.  Patient may need imaging of the abdomen that cannot be provided at the urgent care.  Patient was agreeable with plan.  Vital signs stable at discharge.  Agree with patient self transport to the hospital. Covid 19 test was completed by nursing staff prior to provider seeing patient per nursing protocol. Oxygen saturation 97 percent on discharge.  Final Clinical Impressions(s) / UC Diagnoses   Final diagnoses:  Encounter for screening for COVID-19  Continuous severe abdominal pain  Fever, unspecified     Discharge Instructions      Please go to the emergency department as soon as you leave  urgent care for further evaluation and management.    ED Prescriptions   None    PDMP not reviewed this encounter.   Gustavus Bryant, [place] 05/14/21 1123

## 2021-05-14 NOTE — ED Notes (Signed)
Left after waiting for triage for 5 minutes.

## 2021-05-14 NOTE — Discharge Instructions (Signed)
Please go to the emergency department as soon as you leave urgent care for further evaluation and management. ?

## 2021-05-14 NOTE — ED Triage Notes (Signed)
Pt is here with abdominal pain, fever, and a headache that started last Friday, pt has taken Tylenol to relieve discomfort.

## 2021-05-15 LAB — COVID-19, FLU A+B NAA
Influenza A, NAA: NOT DETECTED
Influenza B, NAA: NOT DETECTED
SARS-CoV-2, NAA: DETECTED — AB

## 2021-07-22 ENCOUNTER — Encounter: Payer: Self-pay | Admitting: Emergency Medicine

## 2021-07-22 ENCOUNTER — Ambulatory Visit
Admission: EM | Admit: 2021-07-22 | Discharge: 2021-07-22 | Disposition: A | Discharge status: Home / Self Care | Payer: Federal, State, Local not specified - PPO | Attending: Internal Medicine | Admitting: Internal Medicine

## 2021-07-22 ENCOUNTER — Other Ambulatory Visit: Payer: Self-pay

## 2021-07-22 DIAGNOSIS — M25571 Pain in right ankle and joints of right foot: Secondary | ICD-10-CM | POA: Diagnosis not present

## 2021-07-22 MED ORDER — INDOMETHACIN 50 MG PO CAPS: 50.0000 mg | ORAL_CAPSULE | Freq: Two times a day (BID) | ORAL | 0 refills | Status: AC

## 2021-07-22 NOTE — ED Provider Notes (Signed)
?[name] ? ? ? ?CSN: [id number] ?Arrival date & time: 07/22/21  0802 ? ? ?  ? ?History   ?Chief Complaint ?Chief Complaint  ?Patient presents with  ? Foot Pain  ? ? ?HPI ?Kelly Valdez is a 61 y.o. male.  ? ?Patient presents with right foot and ankle pain that has been present for approximately 5 days.  Denies any apparent injury.  Patient reports that he has a history of arthritis and gout so is not sure if this is the cause of his pain.  Denies any fevers, body aches, chills.  Patient has taken meloxicam that was prescribed by his PCP as needed for arthritis with minimal improvement.  Patient reports that he has been eating a lot of red meat lately prior to symptoms starting.  Denies any numbness or tingling.  Patient is able to bear weight. ? ? ?Foot Pain ? ? ?Past Medical History:  ?Diagnosis Date  ? Anxiety   ? Arthritis   ? Chronic back pain   ? Chronic headache   ? Chronic knee pain   ? right knee  ? Depression   ? Diabetes mellitus without complication (HCC) 2010  ? type II  ? ED (erectile dysfunction)   ? Hypertension 2010  ? ? ?Patient Active Problem List  ? Diagnosis Date Noted  ? Essential hypertension, benign 12/15/2014  ? Type 2 diabetes mellitus (HCC) 12/15/2014  ? ? ?Past Surgical History:  ?Procedure Laterality Date  ? ELBOW BURSA SURGERY    ? KNEE SURGERY    ? ? ? ? ? ?Home Medications   ? ?Prior to Admission medications   ?Medication Sig Start Date End Date Taking? Authorizing Provider  ?indomethacin (INDOCIN) 50 MG capsule Take 1 capsule (50 mg total) by mouth 2 (two) times daily with a meal for 5 days. 07/22/21 07/27/21 Yes Gustavus Bryant, FNP  ?diclofenac Sodium (VOLTAREN) 1 % GEL APPLY 4 GRAMS TO AFFECTED AREA EVERY 6 HOURS 08/20/20   [provider]  ?lisinopril (ZESTRIL) 10 MG tablet Take 10 mg by mouth daily. Unsure of dose    [provider]  ?metFORMIN (GLUCOPHAGE) 500 MG tablet Take 1 tablet (500 mg total) by mouth 2 (two) times daily with a meal. 10/01/11  12/15/14  Elvina Sidle, MD  ?traZODone (DESYREL) 50 MG tablet Take 50 mg by mouth at bedtime.  03/02/19  [provider]  ? ? ?Family History ?Family History  ?Problem Relation Age of Onset  ? Healthy Mother   ? Healthy Father   ? ? ?Social History ?Social History  ? ?Tobacco Use  ? Smoking status: Never  ? Smokeless tobacco: Never  ?Vaping Use  ? Vaping Use: Never used  ?Substance Use Topics  ? Alcohol use: Yes  ?  Alcohol/week: 2.0 standard drinks  ?  Types: 2 Cans of beer per week  ?  Comment: drink every night   ? Drug use: No  ? ? ? ?Allergies   ?Patient has no known allergies. ? ? ?Review of Systems ?Review of Systems ?Per HPI ? ?Physical Exam ?Triage Vital Signs ?ED Triage Vitals  ?Enc Vitals Group  ?   BP 07/22/21 0812 (!) 154/83  ?   Pulse Rate 07/22/21 0812 (!) 54  ?   Resp 07/22/21 0812 16  ?   Temp 07/22/21 0812 98 ?F (36.7 ?C)  ?   Temp Source 07/22/21 0812 Oral  ?   SpO2 07/22/21 0812 96 %  ?  Weight --   ?   Height --   ?   Head Circumference --   ?   Peak Flow --   ?   Pain Score 07/22/21 0817 7  ?   Pain Loc --   ?   Pain Edu? --   ?   Excl. in GC? --   ? ?No data found. ? ?Updated Vital Signs ?BP (!) 154/83 (BP Location: Left Arm)   Pulse (!) 54   Temp 98 ?F (36.7 ?C) (Oral)   Resp 16   SpO2 96%  ? ?Visual Acuity ?Right Eye Distance:   ?Left Eye Distance:   ?Bilateral Distance:   ? ?Right Eye Near:   ?Left Eye Near:    ?Bilateral Near:    ? ?Physical Exam ?Constitutional:   ?   General: He is not in acute distress. ?   Appearance: Normal appearance. He is not toxic-appearing or diaphoretic.  ?HENT:  ?   Head: Normocephalic and atraumatic.  ?Eyes:  ?   Extraocular Movements: Extraocular movements intact.  ?   Conjunctiva/sclera: Conjunctivae normal.  ?Pulmonary:  ?   Effort: Pulmonary effort is normal.  ?Musculoskeletal:  ?   Right ankle: Swelling present. No deformity. Tenderness present. Normal range of motion. Anterior drawer test negative. Normal pulse.  ?   Right Achilles  Tendon: Normal.  ?   Left ankle: Normal.  ?   Right foot: Normal capillary refill. No swelling, deformity, tenderness, bony tenderness or crepitus. Normal pulse.  ?   Comments: Tenderness to palpation with right lateral malleolus swelling.  Patient has full range of motion.  Neurovascular intact.  No warmth or erythema noted.  No bruising or lacerations noted.  ?Neurological:  ?   General: No focal deficit present.  ?   Mental Status: He is alert and oriented to person, place, and time. Mental status is at baseline.  ?Psychiatric:     ?   Mood and Affect: Mood normal.     ?   Behavior: Behavior normal.     ?   Thought Content: Thought content normal.     ?   Judgment: Judgment normal.  ? ? ? ?UC Treatments / Results  ?Labs ?(all labs ordered are listed, but only abnormal results are displayed) ?Labs Reviewed - No data to display ? ?EKG ? ? ?Radiology ?No results found. ? ?Procedures ?Procedures (including critical care time) ? ?Medications Ordered in UC ?Medications - No data to display ? ?Initial Impression / Assessment and Plan / UC Course  ?I have reviewed the triage vital signs and the nursing notes. ? ?Pertinent labs & imaging results that were available during my care of the patient were reviewed by me and considered in my medical decision making (see chart for details). ? ?  ? ?Differential diagnoses include arthritis versus gout.  Will treat with indomethacin to cover for gout.  Patient advised to not take any additional ibuprofen, Advil, Aleve, meloxicam or any other NSAIDs while taking indomethacin.  Patient has had improvement with indomethacin in the past for similar pain.  Unable to prescribe prednisone as patient has history of diabetes and is not sure how prednisone reacts with blood glucose.  Discussed supportive care with patient as well.  Patient to follow-up with PCP if pain persists or worsens.  No suspicion for any type of septic arthritis.  Discussed return precautions.  Patient verbalized  understanding and was agreeable with plan. ?Final Clinical Impressions(s) / UC Diagnoses  ? ?Final  diagnoses:  ?Acute right ankle pain  ? ? ? ?Discharge Instructions   ? ?  ?It appears that you may have a gout or arthritis flareup of your right ankle.  You have been prescribed medication to help treat this.  Please do not take any additional ibuprofen, Advil, Aleve, meloxicam while taking this medication.  Follow-up with primary care doctor if pain persists. ? ? ? ?ED Prescriptions   ? ? Medication Sig Dispense Auth. Provider  ? indomethacin (INDOCIN) 50 MG capsule Take 1 capsule (50 mg total) by mouth 2 (two) times daily with a meal for 5 days. 10 capsule Ervin Knack E, [place]  ? ?  ? ?PDMP not reviewed this encounter. ?  ?Gustavus Bryant, [place] ?07/22/21 3419 ? ?

## 2021-07-22 NOTE — ED Triage Notes (Signed)
Right foot pain over the weekend. States he doesn't remember injuring it. Hx of gout and diabetes. Swelling around ankle, warm, tender to touch. States his gout usually occurs in his toe, not the ankle.  ?

## 2021-07-22 NOTE — Discharge Instructions (Addendum)
It appears that you may have a gout or arthritis flareup of your right ankle.  You have been prescribed medication to help treat this.  Please do not take any additional ibuprofen, Advil, Aleve, meloxicam while taking this medication.  Follow-up with primary care doctor if pain persists. ?

## 2022-01-14 ENCOUNTER — Ambulatory Visit
Admission: EM | Admit: 2022-01-14 | Discharge: 2022-01-14 | Disposition: A | Discharge status: Home / Self Care | Payer: Federal, State, Local not specified - PPO | Attending: Physician Assistant | Admitting: Physician Assistant

## 2022-01-14 ENCOUNTER — Encounter: Payer: Self-pay | Admitting: Emergency Medicine

## 2022-01-14 ENCOUNTER — Ambulatory Visit (INDEPENDENT_AMBULATORY_CARE_PROVIDER_SITE_OTHER): Payer: Federal, State, Local not specified - PPO

## 2022-01-14 ENCOUNTER — Other Ambulatory Visit: Payer: Self-pay

## 2022-01-14 DIAGNOSIS — S92514A Nondisplaced fracture of proximal phalanx of right lesser toe(s), initial encounter for closed fracture: Secondary | ICD-10-CM

## 2022-01-14 NOTE — ED Provider Notes (Signed)
[name]    CSN: [id number] Arrival date & time: 01/14/22  0815      History   Chief Complaint Chief Complaint  Patient presents with   Toe Pain    HPI EREK KOWAL is a 61 y.o. male.   Patient here today for evaluation of right 5th toe pain after he "stubbed" his toe about a week ago. He reports that he has had the most pain at work when he was trying to wear steel toed boots. He has had swelling that has improved with time. He denies any numbness or tingling. He has not taken any additional medication for symptoms.  The history is provided by the patient.  Toe Pain Pertinent negatives include no shortness of breath.    Past Medical History:  Diagnosis Date   Anxiety    Arthritis    Chronic back pain    Chronic headache    Chronic knee pain    right knee   Depression    Diabetes mellitus without complication (HCC) 2010   type II   ED (erectile dysfunction)    Hypertension 2010    Patient Active Problem List   Diagnosis Date Noted   Essential hypertension, benign 12/15/2014   Type 2 diabetes mellitus (HCC) 12/15/2014    Past Surgical History:  Procedure Laterality Date   ELBOW BURSA SURGERY     KNEE SURGERY         Home Medications    Prior to Admission medications   Medication Sig Start Date End Date Taking? Authorizing Provider  diclofenac Sodium (VOLTAREN) 1 % GEL APPLY 4 GRAMS TO AFFECTED AREA EVERY 6 HOURS 08/20/20   [provider]  lisinopril (ZESTRIL) 10 MG tablet Take 10 mg by mouth daily. Unsure of dose    [provider]  metFORMIN (GLUCOPHAGE) 500 MG tablet Take 1 tablet (500 mg total) by mouth 2 (two) times daily with a meal. 10/01/11 12/15/14  Elvina Sidle, MD  traZODone (DESYREL) 50 MG tablet Take 50 mg by mouth at bedtime.  03/02/19  [provider]    Family History Family History  Problem Relation Age of Onset   Healthy Mother    Healthy Father     Social History Social History    Tobacco Use   Smoking status: Never   Smokeless tobacco: Never  Vaping Use   Vaping Use: Never used  Substance Use Topics   Alcohol use: Yes    Alcohol/week: 2.0 standard drinks of alcohol    Types: 2 Cans of beer per week    Comment: drink every night    Drug use: No     Allergies   Patient has no known allergies.   Review of Systems Review of Systems  Constitutional:  Negative for chills and fever.  Eyes:  Negative for discharge and redness.  Respiratory:  Negative for shortness of breath.   Musculoskeletal:  Positive for arthralgias and joint swelling.  Neurological:  Negative for numbness.     Physical Exam Triage Vital Signs ED Triage Vitals  Enc Vitals Group     BP 01/14/22 0901 137/87     Pulse Rate 01/14/22 0901 60     Resp 01/14/22 0901 18     Temp 01/14/22 0901 98.3 F (36.8 C)     Temp Source 01/14/22 0901 Oral     SpO2 01/14/22 0901 97 %     Weight --      Height --  Head Circumference --      Peak Flow --      Pain Score 01/14/22 0902 4     Pain Loc --      Pain Edu? --      Excl. in GC? --    No data found.  Updated Vital Signs BP 137/87 (BP Location: Left Arm)   Pulse 60   Temp 98.3 F (36.8 C) (Oral)   Resp 18   SpO2 97%   Physical Exam Vitals and nursing note reviewed.  Constitutional:      General: He is not in acute distress.    Appearance: Normal appearance. He is not ill-appearing.  HENT:     Head: Normocephalic and atraumatic.     Nose: Nose normal.  Eyes:     Conjunctiva/sclera: Conjunctivae normal.  Cardiovascular:     Rate and Rhythm: Normal rate.  Pulmonary:     Effort: Pulmonary effort is normal. No respiratory distress.  Musculoskeletal:     Comments: Mild swelling to proximal 5th right toe  Skin:    General: Skin is warm and dry.     Capillary Refill: Normal cap refill to right 5th toe Neurological:     Mental Status: He is alert.     Comments: Gross sensation intact to right 5th toe  Psychiatric:         Mood and Affect: Mood normal.        Behavior: Behavior normal.      UC Treatments / Results  Labs (all labs ordered are listed, but only abnormal results are displayed) Labs Reviewed - No data to display  EKG   Radiology DG Foot Complete Right  Result Date: 01/14/2022 CLINICAL DATA:  Right 5th toe pain after injury. EXAM: RIGHT FOOT COMPLETE - 3+ VIEW COMPARISON:  None Available. FINDINGS: Fracture noted through the proximal phalanx of the right little toe. This is nondisplaced. No subluxation or dislocation. IMPRESSION: Nondisplaced fracture through the proximal phalanx of the right little toe. Electronically Signed   By: Charlett Nose M.D.   On: 01/14/2022 09:11    Procedures Procedures (including critical care time)  Medications Ordered in UC Medications - No data to display  Initial Impression / Assessment and Plan / UC Course  I have reviewed the triage vital signs and the nursing notes.  Pertinent labs & imaging results that were available during my care of the patient were reviewed by me and considered in my medical decision making (see chart for details).    Closed fracture noted on xray- discussed CAM boot vs post op shoe, patient prefers post op shoe and [facility]. Encouraged sooner follow up with any further concerns.   Final Clinical Impressions(s) / UC Diagnoses   Final diagnoses:  Closed nondisplaced fracture of proximal phalanx of lesser toe of right foot, initial encounter   Discharge Instructions   None    ED Prescriptions   None    PDMP not reviewed this encounter.   Tomi Bamberger, PA-C 01/14/22 1024

## 2022-01-14 NOTE — ED Triage Notes (Signed)
Pt sts right pinky toe pain after stubbing toe 1 week ago

## 2022-05-09 ENCOUNTER — Ambulatory Visit
Admission: EM | Admit: 2022-05-09 | Discharge: 2022-05-09 | Disposition: A | Discharge status: Other Acute Inpatient Hospital | Payer: Federal, State, Local not specified - PPO | Attending: Physician Assistant | Admitting: Physician Assistant

## 2022-05-09 DIAGNOSIS — R079 Chest pain, unspecified: Secondary | ICD-10-CM | POA: Diagnosis not present

## 2022-05-09 NOTE — ED Notes (Signed)
Patient is being discharged from the Urgent Care and sent to the Emergency Department via pov with wife . Per myers pa, patient is in need of higher level of care due to chest pain. Patient is aware and verbalizes understanding of plan of care.  Vitals:   05/09/22 0851  BP: 135/89  Pulse: 66  Resp: 16  Temp: 97.8 F (36.6 C)  SpO2: 98%

## 2022-05-09 NOTE — ED Provider Notes (Signed)
[name]    CSN: [id number] Arrival date & time: 05/09/22  0834      History   Chief Complaint Chief Complaint  Patient presents with   Chest Pain    HPI Kelly Valdez is a 61 y.o. male.   Patient here today with spouse for evaluation of left sided chest pain that started at 8 am this morning. He reports that pain radiates to his back and neck. He does have history of HTN, DM.    Chest Pain Associated symptoms: no fever, no numbness and no shortness of breath     Past Medical History:  Diagnosis Date   Anxiety    Arthritis    Chronic back pain    Chronic headache    Chronic knee pain    right knee   Depression    Diabetes mellitus without complication (HCC) 2010   type II   ED (erectile dysfunction)    Hypertension 2010    Patient Active Problem List   Diagnosis Date Noted   Essential hypertension, benign 12/15/2014   Type 2 diabetes mellitus (HCC) 12/15/2014    Past Surgical History:  Procedure Laterality Date   ELBOW BURSA SURGERY     KNEE SURGERY         Home Medications    Prior to Admission medications   Medication Sig Start Date End Date Taking? Authorizing Provider  diclofenac Sodium (VOLTAREN) 1 % GEL APPLY 4 GRAMS TO AFFECTED AREA EVERY 6 HOURS 08/20/20   [provider]  lisinopril (ZESTRIL) 10 MG tablet Take 10 mg by mouth daily. Unsure of dose    [provider]  metFORMIN (GLUCOPHAGE) 500 MG tablet Take 1 tablet (500 mg total) by mouth 2 (two) times daily with a meal. 10/01/11 12/15/14  Elvina Sidle, MD  traZODone (DESYREL) 50 MG tablet Take 50 mg by mouth at bedtime.  03/02/19  [provider]    Family History Family History  Problem Relation Age of Onset   Healthy Mother    Healthy Father     Social History Social History   Tobacco Use   Smoking status: Never   Smokeless tobacco: Never  Vaping Use   Vaping Use: Never used  Substance Use Topics   Alcohol use: Yes     Alcohol/week: 2.0 standard drinks of alcohol    Types: 2 Cans of beer per week    Comment: drink every night    Drug use: Yes    Types: Marijuana     Allergies   Patient has no known allergies.   Review of Systems Review of Systems  Constitutional:  Negative for chills and fever.  Eyes:  Negative for discharge and redness.  Respiratory:  Negative for shortness of breath.   Cardiovascular:  Positive for chest pain.  Neurological:  Negative for numbness.     Physical Exam Triage Vital Signs ED Triage Vitals [05/09/22 0850]  Enc Vitals Group     BP      Pulse      Resp      Temp      Temp src      SpO2      Weight      Height      Head Circumference      Peak Flow      Pain Score 7     Pain Loc      Pain Edu?      Excl. in GC?  No data found.  Updated Vital Signs BP 135/89   Pulse 66   Temp 97.8 F (36.6 C) (Oral)   Resp 16   SpO2 98%   Physical Exam Vitals and nursing note reviewed.  Constitutional:      General: He is not in acute distress.    Appearance: Normal appearance. He is not ill-appearing.  HENT:     Head: Normocephalic and atraumatic.  Eyes:     Conjunctiva/sclera: Conjunctivae normal.  Cardiovascular:     Rate and Rhythm: Normal rate.  Pulmonary:     Effort: Pulmonary effort is normal. No respiratory distress.  Neurological:     Mental Status: He is alert.  Psychiatric:        Mood and Affect: Mood normal.        Behavior: Behavior normal.        Thought Content: Thought content normal.      UC Treatments / Results  Labs (all labs ordered are listed, but only abnormal results are displayed) Labs Reviewed - No data to display  EKG   Radiology No results found.  Procedures Procedures (including critical care time)  Medications Ordered in UC Medications - No data to display  Initial Impression / Assessment and Plan / UC Course  I have reviewed the triage vital signs and the nursing notes.  Pertinent labs & imaging  results that were available during my care of the patient were reviewed by me and considered in my medical decision making (see chart for details).    No EKG for comparison, no ST elevation noted. Recommended further evaluation in the ED for stat troponin testing.  Wife to transport via POV.   Final Clinical Impressions(s) / UC Diagnoses   Final diagnoses:  Chest pain, unspecified type   Discharge Instructions   None    ED Prescriptions   None    PDMP not reviewed this encounter.   Tomi Bamberger, PA-C 05/09/22 [phone]

## 2022-05-09 NOTE — ED Triage Notes (Signed)
Pt presents to uc with co of chest pain since this morning at 8 am. Pain is left sided and radiates to his back and neck. Pt reports pmh htn.

## 2022-07-31 IMAGING — DX DG ANKLE COMPLETE 3+V*R*
3 series · 3 of 3 positions shown · non-contrast
Comparison: None.

CLINICAL DATA: Lateral pain.  Status post injury.

EXAM:
RIGHT ANKLE - COMPLETE 3+ VIEW

[ankle ap]
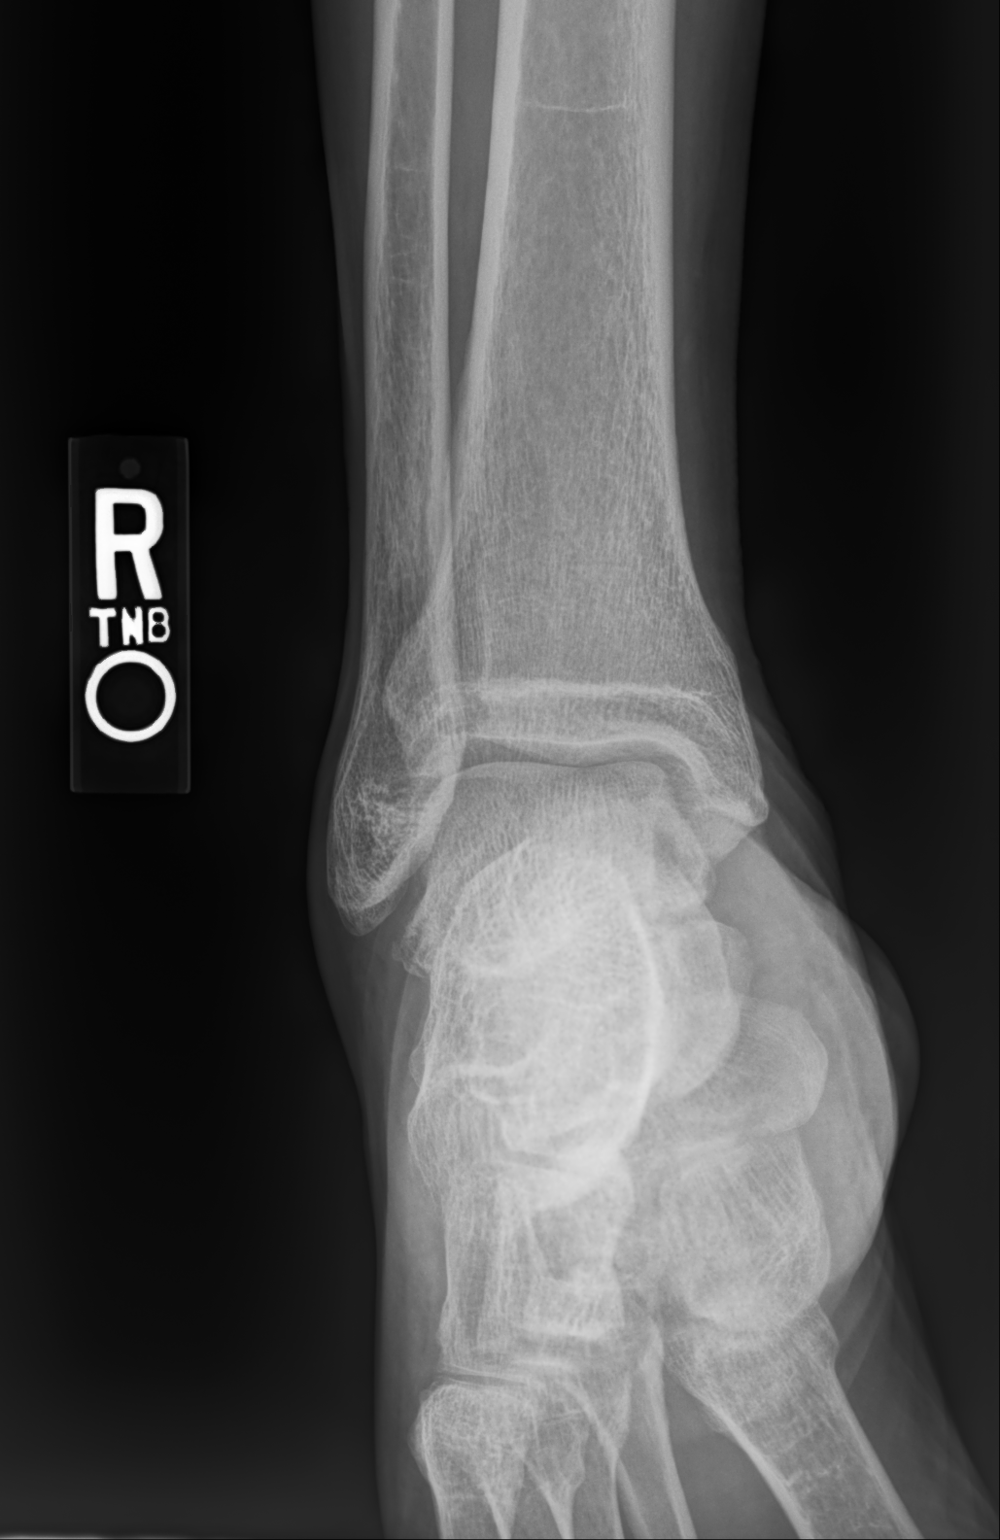

[ankle medial oblique]
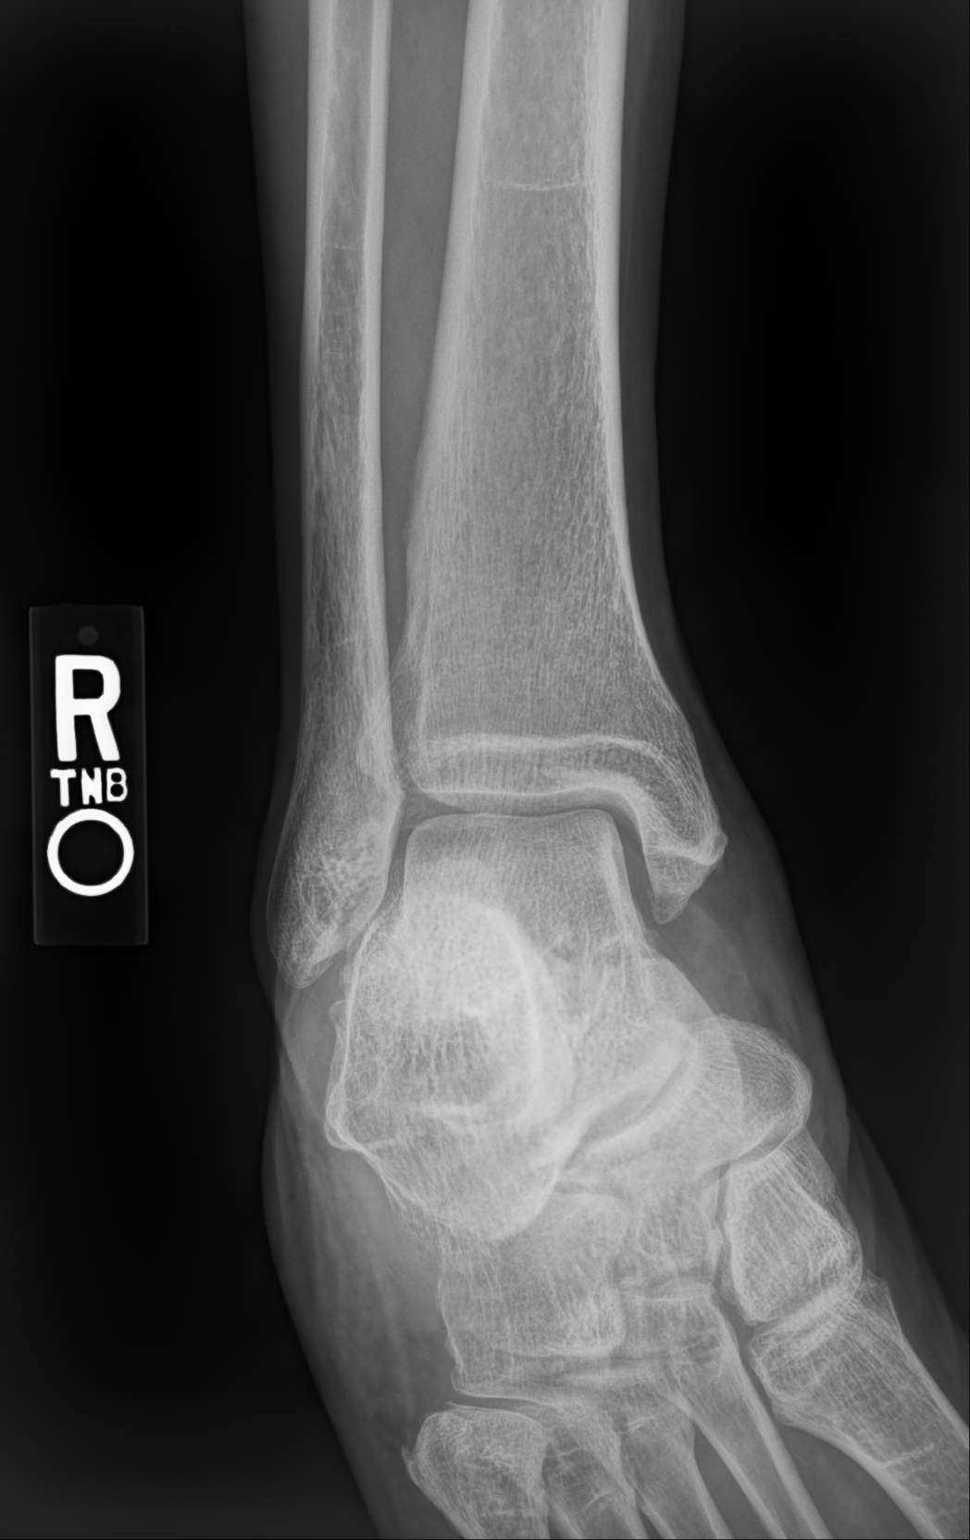

[ankle lat]
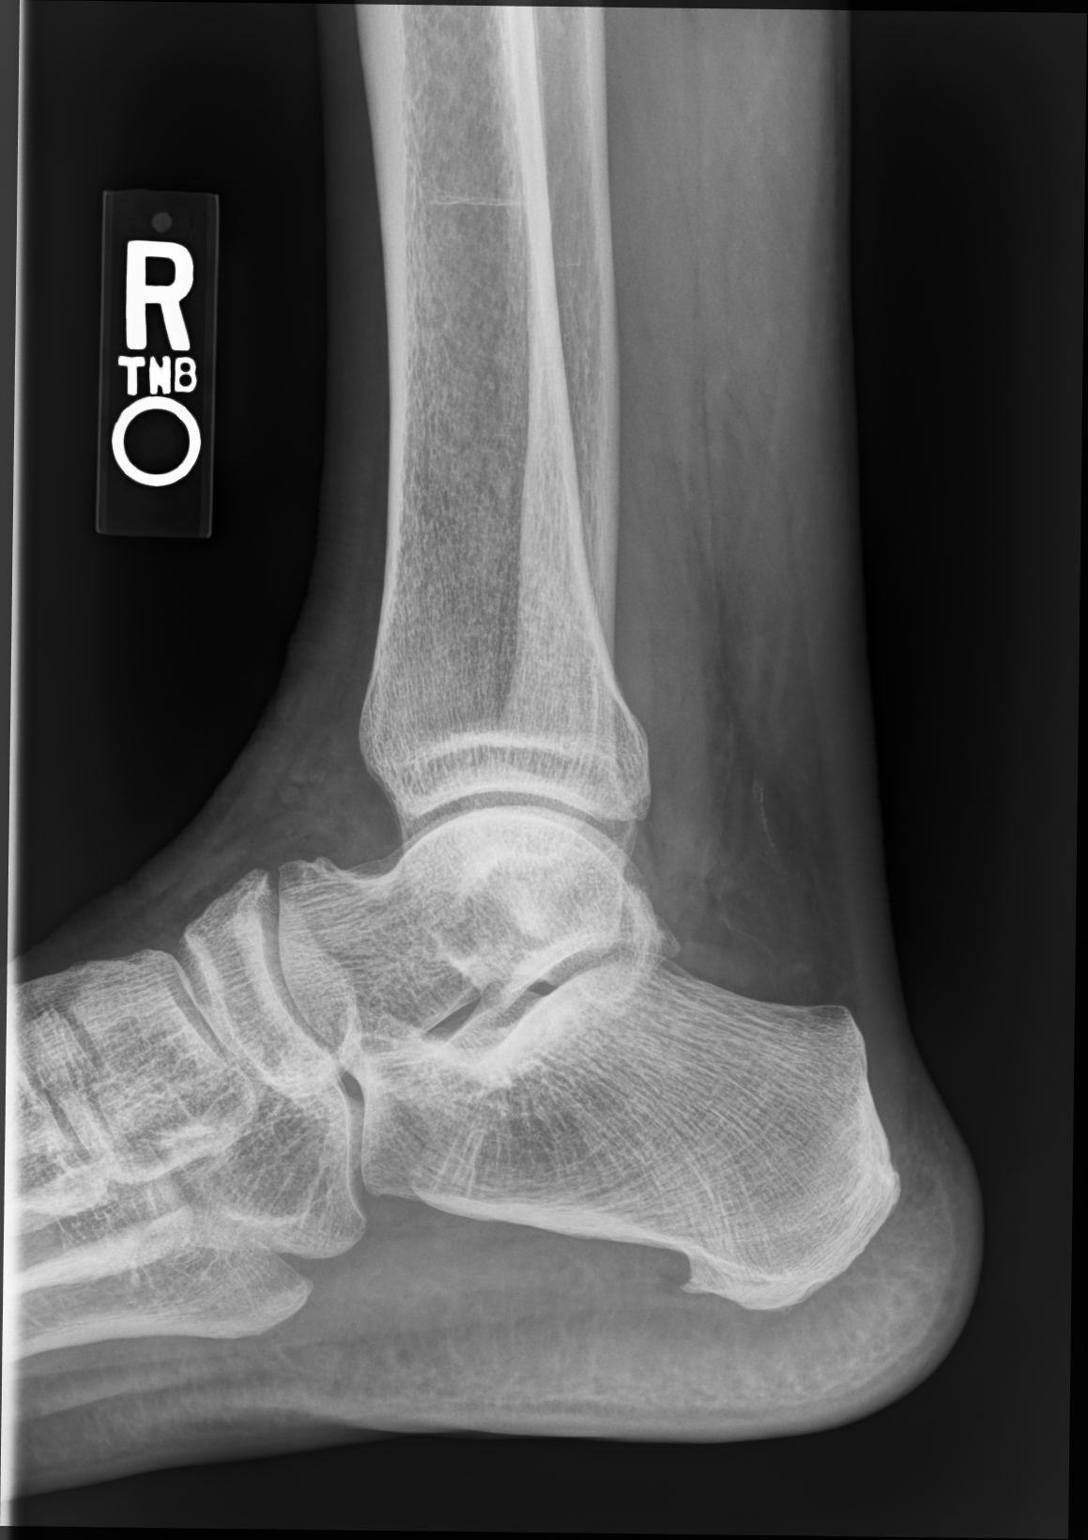

[3 of 3 positions shown; findings below may reference images not displayed]

FINDINGS: There is no evidence of fracture, dislocation, or joint effusion.
There is no evidence of arthropathy or other focal bone abnormality.
Soft tissues are unremarkable.
IMPRESSION: Negative.

## 2022-09-16 ENCOUNTER — Ambulatory Visit
Admission: EM | Admit: 2022-09-16 | Discharge: 2022-09-16 | Disposition: A | Discharge status: Home / Self Care | Payer: Federal, State, Local not specified - PPO

## 2022-09-16 DIAGNOSIS — M7022 Olecranon bursitis, left elbow: Secondary | ICD-10-CM

## 2022-09-16 NOTE — ED Provider Notes (Signed)
[name]    CSN: [id number] Arrival date & time: 09/16/22  0802      History   Chief Complaint Chief Complaint  Patient presents with   Elbow Pain    HPI Kelly Valdez is a 62 y.o. male.   Patient presents with left elbow pain and swelling that has been present for about 1 week.  Patient reports that he may have banged it on something at work but is not sure.  Reports has been taking ibuprofen 600 mg at home with no improvement in symptoms.  Reports history of gout and states that this feels similar.  Was seen at [facility] walk-in clinic yesterday and prescribed cephalexin antibiotic which he has been taking as prescribed.  Patient reports that he had an x-ray of his elbow as well but was not told the result.  Reports that he had a surgery in the elbow approximately 2 years ago to have a "muscle that was sticking out repaired". Denies any fever recently.      Past Medical History:  Diagnosis Date   Anxiety    Arthritis    Chronic back pain    Chronic headache    Chronic knee pain    right knee   Depression    Diabetes mellitus without complication (HCC) 2010   type II   ED (erectile dysfunction)    Hypertension 2010    Patient Active Problem List   Diagnosis Date Noted   Essential hypertension, benign 12/15/2014   Type 2 diabetes mellitus (HCC) 12/15/2014    Past Surgical History:  Procedure Laterality Date   ELBOW BURSA SURGERY     KNEE SURGERY         Home Medications    Prior to Admission medications   Medication Sig Start Date End Date Taking? Authorizing Provider  diclofenac Sodium (VOLTAREN) 1 % GEL APPLY 4 GRAMS TO AFFECTED AREA EVERY 6 HOURS 08/20/20   [provider]  lisinopril (ZESTRIL) 10 MG tablet Take 10 mg by mouth daily. Unsure of dose    [provider]  metFORMIN (GLUCOPHAGE) 500 MG tablet Take 1 tablet (500 mg total) by mouth 2 (two) times daily with a meal. 10/01/11 12/15/14  Elvina Sidle, MD  traZODone  (DESYREL) 50 MG tablet Take 50 mg by mouth at bedtime.  03/02/19  [provider]    Family History Family History  Problem Relation Age of Onset   Healthy Mother    Healthy Father     Social History Social History   Tobacco Use   Smoking status: Never   Smokeless tobacco: Never  Vaping Use   Vaping Use: Never used  Substance Use Topics   Alcohol use: Yes    Alcohol/week: 2.0 standard drinks of alcohol    Types: 2 Cans of beer per week    Comment: drink every night    Drug use: Yes    Types: Marijuana     Allergies   Patient has no known allergies.   Review of Systems Review of Systems Per HPI  Physical Exam Triage Vital Signs ED Triage Vitals  Enc Vitals Group     BP 09/16/22 0820 (!) 147/86     Pulse Rate 09/16/22 0820 (!) 57     Resp 09/16/22 0820 16     Temp 09/16/22 0820 98.3 F (36.8 C)     Temp Source 09/16/22 0820 Oral     SpO2 09/16/22 0820 97 %     Weight --  Height --      Head Circumference --      Peak Flow --      Pain Score 09/16/22 0821 10     Pain Loc --      Pain Edu? --      Excl. in GC? --    No data found.  Updated Vital Signs BP (!) 147/86 (BP Location: Right Arm)   Pulse (!) 57   Temp 98.3 F (36.8 C) (Oral)   Resp 16   SpO2 97%   Visual Acuity Right Eye Distance:   Left Eye Distance:   Bilateral Distance:    Right Eye Near:   Left Eye Near:    Bilateral Near:     Physical Exam Constitutional:      General: He is not in acute distress.    Appearance: Normal appearance. He is not toxic-appearing or diaphoretic.  HENT:     Head: Normocephalic and atraumatic.  Eyes:     Extraocular Movements: Extraocular movements intact.     Conjunctiva/sclera: Conjunctivae normal.  Pulmonary:     Effort: Pulmonary effort is normal.  Musculoskeletal:     Comments: Patient has area of fluctuance overlying the left olecranon.  No obvious discoloration noted.  Patient has full range of motion of elbow.  Grip strength  is 5/5.  Appears to be neurovascularly intact.  Neurological:     General: No focal deficit present.     Mental Status: He is alert and oriented to person, place, and time. Mental status is at baseline.  Psychiatric:        Mood and Affect: Mood normal.        Behavior: Behavior normal.        Thought Content: Thought content normal.        Judgment: Judgment normal.      UC Treatments / Results  Labs (all labs ordered are listed, but only abnormal results are displayed) Labs Reviewed - No data to display  EKG   Radiology No results found.  Procedures Procedures (including critical care time)  Medications Ordered in UC Medications - No data to display  Initial Impression / Assessment and Plan / UC Course  I have reviewed the triage vital signs and the nursing notes.  Pertinent labs & imaging results that were available during my care of the patient were reviewed by me and considered in my medical decision making (see chart for details).     It appears the patient was treated with cephalexin antibiotic yesterday at walk-in clinic due to concern for cellulitis of the left elbow.  Although, x-ray that was completed shows olecranon bursitis.  Patient has a decent amount of fluid on the elbow that I do think would benefit from being removed promptly.  Therefore, patient was advised that being evaluated by orthopedic specialist today would be reasonable.  Called [facility] and scheduled patient an appointment for today at 10 AM.  He was agreeable with this plan and left via self transport to go to this appointment for further evaluation and management by orthopedic specialist. Final Clinical Impressions(s) / UC Diagnoses   Final diagnoses:  Olecranon bursitis of left elbow     Discharge Instructions      I have scheduled you an appointment at [facility] orthopedic specialist today at 10 AM to have your elbow evaluated.    ED Prescriptions    None    PDMP not reviewed this encounter.   [address], [place] E,  FNP 09/16/22 1478

## 2022-09-16 NOTE — ED Triage Notes (Signed)
Pt states left elbow pain for the past week thinks he may have banged it at work.  Left elbow swollen tender to touch. Has been taking diclofenac at home with no relief. Pt states he has a history of gout.

## 2022-09-16 NOTE — Discharge Instructions (Signed)
I have scheduled you an appointment at [facility] orthopedic specialist today at 10 AM to have your elbow evaluated.
